# Patient Record
Sex: Male | Born: 1970 | Race: Black or African American | Hispanic: No | Smoking: Never smoker
Health system: Southern US, Community
[De-identification: ages and names within clinical notes are randomized; demographics above are authoritative.]

## PROBLEM LIST (undated history)

## (undated) DIAGNOSIS — F419 Anxiety disorder, unspecified: Secondary | ICD-10-CM

## (undated) DIAGNOSIS — F32A Depression, unspecified: Secondary | ICD-10-CM

## (undated) DIAGNOSIS — B019 Varicella without complication: Secondary | ICD-10-CM

## (undated) HISTORY — PX: APPENDECTOMY: SHX54

## (undated) HISTORY — DX: Varicella without complication: B01.9

## (undated) HISTORY — DX: Depression, unspecified: F32.A

## (undated) HISTORY — DX: Anxiety disorder, unspecified: F41.9

## (undated) HISTORY — PX: ESOPHAGOGASTRODUODENOSCOPY: SHX1529

---

## 2008-01-03 ENCOUNTER — Emergency Department: Payer: Self-pay | Admitting: Emergency Medicine

## 2008-01-27 ENCOUNTER — Emergency Department: Payer: Self-pay | Admitting: Emergency Medicine

## 2008-02-03 ENCOUNTER — Emergency Department: Payer: Self-pay | Admitting: Emergency Medicine

## 2008-03-18 ENCOUNTER — Emergency Department: Payer: Self-pay | Admitting: Emergency Medicine

## 2008-03-25 ENCOUNTER — Emergency Department: Payer: Self-pay | Admitting: Emergency Medicine

## 2018-09-07 HISTORY — PX: COLONOSCOPY: SHX174

## 2019-12-08 ENCOUNTER — Encounter: Payer: Self-pay | Admitting: Family Medicine

## 2019-12-08 ENCOUNTER — Ambulatory Visit (INDEPENDENT_AMBULATORY_CARE_PROVIDER_SITE_OTHER): Payer: Self-pay | Admitting: Family Medicine

## 2019-12-08 ENCOUNTER — Other Ambulatory Visit: Payer: Self-pay

## 2019-12-08 VITALS — BP 98/62 | HR 67 | Temp 98.0°F | Ht 68.75 in | Wt 117.0 lb

## 2019-12-08 DIAGNOSIS — Z13220 Encounter for screening for lipoid disorders: Secondary | ICD-10-CM

## 2019-12-08 DIAGNOSIS — Z1159 Encounter for screening for other viral diseases: Secondary | ICD-10-CM

## 2019-12-08 DIAGNOSIS — R636 Underweight: Secondary | ICD-10-CM

## 2019-12-08 DIAGNOSIS — Z7689 Persons encountering health services in other specified circumstances: Secondary | ICD-10-CM

## 2019-12-08 DIAGNOSIS — Z114 Encounter for screening for human immunodeficiency virus [HIV]: Secondary | ICD-10-CM

## 2019-12-08 NOTE — Progress Notes (Signed)
   Subjective:    Patient ID: Derrick Quinn, male    DOB: 1970-08-12, 49 y.o.   MRN: [id number]  HPI Chief Complaint  Patient presents with  . New Patient (Initial Visit)    pt desire to gain weight, wants to discuss options   THis is a 49 yo male who presents today to establish care. His son established with me earlier this week. Will be starting a new job at [employer]. Lives with 34 yo son.   Last CPE- long time ago Colonoscopy- last year, had h. pylori Tdap- unsure Covid- has not had, concerned Flu- not usually Dental- over due Eye- this year Exercise- walking, active, household/ yard work ETOH-  2x/ year  Mother with cancer. Still alive. Unsure what kind.   Father- healthy  Underweight- usually 130-145, lost weight with hospitalization July 2020. Very sick. H. Pylori. Has gained some weight back, was 98 pounds.   Review of Systems Denies headache, visual changes, chest pain, shortness of breath, abdominal pain, nausea, vomiting, diarrhea, constipation, dysuria, hematuria, muscle/joint pain    Objective:   Physical Exam Physical Exam  Constitutional: Oriented to person, place, and time. Appears well-developed and well-nourished.  HENT:  Head: Normocephalic and atraumatic.  Eyes: Conjunctivae are normal.  Neck: Normal range of motion. Neck supple.  Cardiovascular: Normal rate, regular rhythm and normal heart sounds.   Pulmonary/Chest: Effort normal and breath sounds normal.  Musculoskeletal: No lower extremity edema.   Neurological: Alert and oriented to person, place, and time.  Skin: Skin is warm and dry.  Psychiatric: Normal mood and affect. Behavior is normal. Judgment and thought content normal.  Vitals reviewed.     BP 98/62   Pulse 67   Temp 98 F (36.7 C) (Temporal)   Ht 5' 8.75" (1.746 m)   Wt 117 lb (53.1 kg)   SpO2 99%   BMI 17.40 kg/m      Depression screen [facility] 2/9 12/08/2019  Decreased Interest 0  Down, Depressed, Hopeless 0  PHQ - 2 Score  0    Assessment & Plan:  1. Encounter to establish care - will request records from prior PCP  2. Underweight - suggested he get lab work, he wishes to return at a later time - encouraged him to eat adequate protein, fat - has gained back some weight since illness - TSH; Future - Lipid panel; Future - CBC with Differential/Platelet; Future - Comprehensive metabolic panel; Future  3. Encounter for hepatitis C screening test for low risk patient - Hepatitis C antibody; Future  4. Screening for HIV without presence of risk factors - HIV Antibody (routine testing w rflx); Future  5. Screening for lipid disorders - Lipid panel; Future  This visit occurred during the SARS-CoV-2 public health emergency.  Safety protocols were in place, including screening questions prior to the visit, additional usage of staff PPE, and extensive cleaning of exam room while observing appropriate contact time as indicated for disinfecting solutions.    Olean Ree, FNP-BC  [facility], [facility]  12/10/2019 8:41 AM

## 2019-12-10 ENCOUNTER — Encounter: Payer: Self-pay | Admitting: Family Medicine

## 2019-12-11 ENCOUNTER — Other Ambulatory Visit: Payer: Self-pay

## 2020-07-15 ENCOUNTER — Encounter: Payer: Self-pay | Admitting: Emergency Medicine

## 2020-07-15 ENCOUNTER — Ambulatory Visit
Admission: EM | Admit: 2020-07-15 | Discharge: 2020-07-15 | Disposition: A | Discharge status: Home / Self Care | Payer: Self-pay | Attending: Emergency Medicine | Admitting: Emergency Medicine

## 2020-07-15 ENCOUNTER — Other Ambulatory Visit: Payer: Self-pay

## 2020-07-15 DIAGNOSIS — B349 Viral infection, unspecified: Secondary | ICD-10-CM

## 2020-07-15 DIAGNOSIS — Z20822 Contact with and (suspected) exposure to covid-19: Secondary | ICD-10-CM

## 2020-07-15 NOTE — ED Provider Notes (Signed)
Derrick Quinn    CSN: [id number] Arrival date & time: 07/15/20  1038      History   Chief Complaint Chief Complaint  Patient presents with  . Back Pain    HPI Derrick Quinn is a 50 y.o. male.   Patient presents with 3-day history of headache, back pain, nonproductive cough, and low-grade fever.  T-max 100.7.  He requests a COVID test.  He denies rash, ear pain, sore throat, shortness of breath, abdominal pain, dysuria, vomiting, diarrhea, or other symptoms.  His back pain is nonradiating and has improved with OTC pain reliever.  He denies pertinent medical history.  No falls or injury.  The history is provided by the patient.    Past Medical History:  Diagnosis Date  . Chicken pox   . Depression     There are no problems to display for this patient.   Past Surgical History:  Procedure Laterality Date  . APPENDECTOMY    . COLONOSCOPY  09/2018       Home Medications    Prior to Admission medications   Not on File    Family History Family History  Problem Relation Age of Onset  . Cancer Mother   . Mental illness Sister     Social History Social History   Tobacco Use  . Smoking status: Never Smoker  . Smokeless tobacco: Never Used  Vaping Use  . Vaping Use: Never used  Substance Use Topics  . Alcohol use: Yes    Comment: two times per year  . Drug use: Never     Allergies   Patient has no known allergies.   Review of Systems Review of Systems  Constitutional: Positive for fever. Negative for chills.  HENT: Negative for ear pain and sore throat.   Respiratory: Positive for cough. Negative for shortness of breath.   Cardiovascular: Negative for chest pain and palpitations.  Gastrointestinal: Negative for abdominal pain, diarrhea and vomiting.  Genitourinary: Negative for dysuria and hematuria.  Musculoskeletal: Positive for back pain. Negative for arthralgias.  Skin: Negative for color change and rash.  Neurological: Positive for  headaches. Negative for syncope, weakness and numbness.  All other systems reviewed and are negative.    Physical Exam Triage Vital Signs ED Triage Vitals  Enc Vitals Group     BP      Pulse      Resp      Temp      Temp src      SpO2      Weight      Height      Head Circumference      Peak Flow      Pain Score      Pain Loc      Pain Edu?      Excl. in GC?    No data found.  Updated Vital Signs BP 120/78 (BP Location: Left Arm)   Pulse 76   Temp 99.2 F (37.3 C) (Oral)   Resp 16   SpO2 96%   Visual Acuity Right Eye Distance:   Left Eye Distance:   Bilateral Distance:    Right Eye Near:   Left Eye Near:    Bilateral Near:     Physical Exam Vitals and nursing note reviewed.  Constitutional:      General: He is not in acute distress.    Appearance: He is well-developed. He is not ill-appearing.  HENT:     Head: Normocephalic and atraumatic.  Right Ear: Tympanic membrane normal.     Left Ear: Tympanic membrane normal.     Nose: Nose normal.     Mouth/Throat:     Mouth: Mucous membranes are moist.     Pharynx: Oropharynx is clear.  Eyes:     Conjunctiva/sclera: Conjunctivae normal.  Cardiovascular:     Rate and Rhythm: Normal rate and regular rhythm.     Heart sounds: Normal heart sounds.  Pulmonary:     Effort: Pulmonary effort is normal. No respiratory distress.     Breath sounds: Normal breath sounds.  Abdominal:     Palpations: Abdomen is soft.     Tenderness: There is no abdominal tenderness. There is no right CVA tenderness, left CVA tenderness, guarding or rebound.  Musculoskeletal:        General: No swelling, tenderness, deformity or signs of injury. Normal range of motion.     Cervical back: Neck supple.  Skin:    General: Skin is warm and dry.     Findings: No bruising, erythema, lesion or rash.  Neurological:     General: No focal deficit present.     Mental Status: He is alert and oriented to person, place, and time.      Sensory: No sensory deficit.     Motor: No weakness.     Gait: Gait normal.  Psychiatric:        Mood and Affect: Mood normal.        Behavior: Behavior normal.      UC Treatments / Results  Labs (all labs ordered are listed, but only abnormal results are displayed) Labs Reviewed  NOVEL CORONAVIRUS, NAA    EKG   Radiology No results found.  Procedures Procedures (including critical care time)  Medications Ordered in UC Medications - No data to display  Initial Impression / Assessment and Plan / UC Course  I have reviewed the triage vital signs and the nursing notes.  Pertinent labs & imaging results that were available during my care of the patient were reviewed by me and considered in my medical decision making (see chart for details).   Viral illness, COVID test.  Patient is well-appearing and his exam is reassuring.  Afebrile, vital signs stable.  COVID pending.  Instructed patient to self quarantine until the test result is back.  Discussed symptomatic treatment including Tylenol or ibuprofen, rest, hydration.  Instructed patient to follow up with PCP if his symptoms are not improving.  Patient agrees to plan of care.    Final Clinical Impressions(s) / UC Diagnoses   Final diagnoses:  Encounter for laboratory testing for COVID-19 virus  Viral illness     Discharge Instructions     Your COVID test is pending.  You should self quarantine until the test result is back.    Take Tylenol or ibuprofen as needed for fever or discomfort.  Rest and keep yourself hydrated.    Follow-up with your primary care provider if your symptoms are not improving.        ED Prescriptions    None     PDMP not reviewed this encounter.   Mickie Bail, NP 07/15/20 1139

## 2020-07-15 NOTE — ED Triage Notes (Signed)
Saturday evening started having back pain and headache. Neither area is as painful  as on Saturday.  Pain involved lower back.

## 2020-07-15 NOTE — Discharge Instructions (Signed)
Your COVID test is pending.  You should self quarantine until the test result is back.    Take Tylenol or ibuprofen as needed for fever or discomfort.  Rest and keep yourself hydrated.    Follow-up with your primary care provider if your symptoms are not improving.     

## 2020-07-16 LAB — NOVEL CORONAVIRUS, NAA: SARS-CoV-2, NAA: DETECTED — AB

## 2020-07-16 LAB — SARS-COV-2, NAA 2 DAY TAT

## 2021-06-03 ENCOUNTER — Encounter: Payer: Self-pay | Admitting: Family Medicine

## 2021-06-03 ENCOUNTER — Ambulatory Visit (INDEPENDENT_AMBULATORY_CARE_PROVIDER_SITE_OTHER): Payer: Self-pay | Admitting: Family Medicine

## 2021-06-03 ENCOUNTER — Other Ambulatory Visit: Payer: Self-pay

## 2021-06-03 ENCOUNTER — Ambulatory Visit (INDEPENDENT_AMBULATORY_CARE_PROVIDER_SITE_OTHER)
Admission: RE | Admit: 2021-06-03 | Discharge: 2021-06-03 | Disposition: A | Discharge status: Home / Self Care | Payer: Self-pay | Source: Ambulatory Visit | Attending: Family Medicine | Admitting: Family Medicine

## 2021-06-03 VITALS — BP 122/74 | HR 78 | Temp 97.3°F | Ht 68.75 in | Wt 126.1 lb

## 2021-06-03 DIAGNOSIS — M25562 Pain in left knee: Secondary | ICD-10-CM

## 2021-06-03 DIAGNOSIS — G8929 Other chronic pain: Secondary | ICD-10-CM

## 2021-06-03 DIAGNOSIS — M25511 Pain in right shoulder: Secondary | ICD-10-CM | POA: Insufficient documentation
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
## 2021-06-03 NOTE — Patient Instructions (Addendum)
Try the passive range of motion shoulder exercises ? ?Use voltaren gel 1% over the counter on affected areas up to four times daily (knee and shoulder)  ? ?Ask the pharmacist about over the counter lidocaine topical also  ? ?Use ice also for 10 minutes when you can , for pain and swelling ? ?Xrays today  ?Plan to follow  ? ? ? ? ? ?
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
## 2021-06-05 ENCOUNTER — Telehealth: Payer: Self-pay | Admitting: *Deleted
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
## 2021-06-05 NOTE — Telephone Encounter (Signed)
Left VM requesting pt to call the office back 

## 2021-06-09 ENCOUNTER — Telehealth: Payer: Self-pay

## 2021-06-09 ENCOUNTER — Telehealth: Payer: Self-pay | Admitting: Family Medicine

## 2021-06-09 DIAGNOSIS — G8929 Other chronic pain: Secondary | ICD-10-CM

## 2021-06-09 NOTE — Telephone Encounter (Signed)
Addressed through result notes  

## 2021-06-09 NOTE — Telephone Encounter (Signed)
Called lvm for patient to call us back regarding results  ?

## 2021-06-09 NOTE — Telephone Encounter (Signed)
-----   Message from Shon Millet, [place] sent at 06/09/2021 12:03 PM EDT ----- ?Pt called back I advised him of xray results. Pt does want to see Ortho he said where every Dr. Milinda Antis recommend is fine ([place] or [place]), pt advised referrals can take up to 2 weeks and if he hasn't heard anything after to 2 weeks to call us back and let us know. Pt verbalized understanding  ?
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
## 2021-06-23 ENCOUNTER — Ambulatory Visit (INDEPENDENT_AMBULATORY_CARE_PROVIDER_SITE_OTHER): Payer: Self-pay | Admitting: Orthopaedic Surgery

## 2021-06-23 DIAGNOSIS — M25562 Pain in left knee: Secondary | ICD-10-CM

## 2021-06-23 DIAGNOSIS — G8929 Other chronic pain: Secondary | ICD-10-CM
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
## 2021-06-24 ENCOUNTER — Other Ambulatory Visit: Payer: Self-pay

## 2021-06-24 DIAGNOSIS — G8929 Other chronic pain: Secondary | ICD-10-CM

## 2021-07-05 ENCOUNTER — Other Ambulatory Visit: Payer: No Typology Code available for payment source

## 2021-07-09 ENCOUNTER — Ambulatory Visit (INDEPENDENT_AMBULATORY_CARE_PROVIDER_SITE_OTHER): Payer: No Typology Code available for payment source | Admitting: Nurse Practitioner

## 2021-07-09 ENCOUNTER — Ambulatory Visit (INDEPENDENT_AMBULATORY_CARE_PROVIDER_SITE_OTHER)
Admission: RE | Admit: 2021-07-09 | Discharge: 2021-07-09 | Disposition: A | Discharge status: Home / Self Care | Payer: No Typology Code available for payment source | Source: Ambulatory Visit | Attending: Nurse Practitioner | Admitting: Nurse Practitioner

## 2021-07-09 ENCOUNTER — Encounter: Payer: Self-pay | Admitting: Nurse Practitioner

## 2021-07-09 VITALS — BP 100/68 | HR 79 | Temp 96.7°F | Resp 16 | Ht 68.75 in | Wt 127.4 lb

## 2021-07-09 DIAGNOSIS — M545 Low back pain, unspecified: Secondary | ICD-10-CM

## 2021-07-09 DIAGNOSIS — F419 Anxiety disorder, unspecified: Secondary | ICD-10-CM

## 2021-07-09 DIAGNOSIS — Z8619 Personal history of other infectious and parasitic diseases: Secondary | ICD-10-CM | POA: Insufficient documentation

## 2021-07-09 DIAGNOSIS — F32A Depression, unspecified: Secondary | ICD-10-CM | POA: Insufficient documentation

## 2021-07-09 MED ORDER — HYDROXYZINE HCL 10 MG PO TABS: 10.0000 mg | ORAL_TABLET | Freq: Three times a day (TID) | ORAL | 0 refills | Status: DC | PRN

## 2021-07-09 NOTE — Patient Instructions (Signed)
Nice to see you today ?I will be in touch with labs and xray once I have the results ?Follow up with me in 3 months for your physical  ?

## 2021-07-09 NOTE — Assessment & Plan Note (Addendum)
Seems to be a chronic thing.  Was evaluated in [place], pending records.  We will obtain lumbar picture in office today.  Patient has tried physical therapy in the past along with medication management it sounds like.  Refused injections.  States MRI of the back approximately a few years ago.  No red flag symptoms on exam.  Likely will need to be referred to orthopedist for further management.  He is already seeing Dr. Jean Rosenthal in regards to his left knee ?

## 2021-07-09 NOTE — Assessment & Plan Note (Signed)
Patient has a history of H. pylori.  Per his report he was treated he is concerned that it was not eradicated.  We will send in stool sample to make sure H. pylori has been eradicated. ?
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
## 2021-07-09 NOTE — Assessment & Plan Note (Signed)
Straight the same states that he is to be on Xanax and another medication he cannot know the name of.  PHQ-9 and GAD-7 administered in office patient denies HI/SI/AVH.  We will start patient on hydroxyzine 10 mg 3 times daily as needed anxiety.  Follow-up if no improvement ?

## 2021-07-11 ENCOUNTER — Other Ambulatory Visit: Payer: No Typology Code available for payment source

## 2021-07-16 ENCOUNTER — Ambulatory Visit
Admission: EM | Admit: 2021-07-16 | Discharge: 2021-07-16 | Disposition: A | Discharge status: Home / Self Care | Payer: No Typology Code available for payment source | Attending: Emergency Medicine | Admitting: Emergency Medicine

## 2021-07-16 DIAGNOSIS — G8929 Other chronic pain: Secondary | ICD-10-CM

## 2021-07-16 DIAGNOSIS — M545 Low back pain, unspecified: Secondary | ICD-10-CM

## 2021-07-16 MED ORDER — IBUPROFEN 600 MG PO TABS: 600.0000 mg | ORAL_TABLET | Freq: Four times a day (QID) | ORAL | 0 refills | Status: DC | PRN

## 2021-07-16 NOTE — ED Triage Notes (Signed)
Patient presents to Urgent Care with complaints of back pain since this morning. He states he has a hx of chronic back pain. States it is worse today when he woke up trying to stand. He states it feels like he "locked his back." Has not taken any meds.  ?

## 2021-07-16 NOTE — Discharge Instructions (Addendum)
Take the ibuprofen as directed for your back pain.  Follow up with an orthopedist or your primary care provider if your symptoms are not improving.   ? ?

## 2021-07-16 NOTE — ED Provider Notes (Addendum)
?[facility] ? ? ? ?CSN: [id number] ?Arrival date & time: 07/16/21  [id number] ? ? ?  ? ?History   ?Chief Complaint ?Chief Complaint  ?Patient presents with  ? Back Pain  ? ? ?HPI ?Derrick Quinn is a 51 y.o. male.  Patient presents with back pain for several years.  He states when he woke up this morning his back pain was sharp and 10/10.  It is currently 7/10 and feels "tight."  He states he is unable to go to work today and needs a note.  No OTC medications taken.  No recent falls or injury.  He denies numbness, weakness, paresthesias, saddle anesthesia, abdominal pain, dysuria, hematuria, vomiting, diarrhea, constipation, loss of bowel/bladder control, or other symptoms.  Patient was seen by his PCP for his back pain on 07/09/2021;  X-ray of the lumbar spine obtained and it showed degenerative changes; PCP's note indicates that the back pain is chronic and that patient should see Ortho.  He is being followed by Ortho for chronic left knee pain. ? ? ? ?The history is provided by the patient and medical records.  ? ?Past Medical History:  ?Diagnosis Date  ? Anxiety   ? Chicken pox   ? Depression   ? ? ?Patient Active Problem List  ? Diagnosis Date Noted  ? Lumbar pain 07/09/2021  ? History of Helicobacter pylori infection 07/09/2021  ? Anxiety 07/09/2021  ? Left knee pain 06/03/2021  ? Right shoulder pain 06/03/2021  ? ? ?Past Surgical History:  ?Procedure Laterality Date  ? APPENDECTOMY    ? COLONOSCOPY  09/2018  ? ? ? ? ? ?Home Medications   ? ?Prior to Admission medications   ?Medication Sig Start Date End Date Taking? Authorizing Provider  ?ibuprofen (ADVIL) 600 MG tablet Take 1 tablet (600 mg total) by mouth every 6 (six) hours as needed. 07/16/21  Yes Sharion Balloon, NP  ?hydrOXYzine (ATARAX) 10 MG tablet Take 1 tablet (10 mg total) by mouth 3 (three) times daily as needed. 07/09/21   Michela Pitcher, NP  ? ? ?Family History ?Family History  ?Problem Relation Age of Onset  ? Cancer Mother   ? Mental illness  Sister   ? Diabetes Paternal Grandmother   ? Hypertension Paternal Grandfather   ? ? ?Social History ?Social History  ? ?Tobacco Use  ? Smoking status: Never  ? Smokeless tobacco: Never  ?Vaping Use  ? Vaping Use: Never used  ?Substance Use Topics  ? Alcohol use: Yes  ?  Comment: two times per year  ? Drug use: Never  ? ? ? ?Allergies   ?Patient has no known allergies. ? ? ?Review of Systems ?Review of Systems  ?Constitutional:  Negative for chills and fever.  ?Gastrointestinal:  Negative for abdominal pain, constipation, diarrhea and vomiting.  ?Genitourinary:  Negative for dysuria, frequency, hematuria, penile discharge and testicular pain.  ?Musculoskeletal:  Positive for back pain. Negative for gait problem.  ?Skin:  Negative for color change, rash and wound.  ?Neurological:  Negative for weakness and numbness.  ?All other systems reviewed and are negative. ? ? ?Physical Exam ?Triage Vital Signs ?ED Triage Vitals  ?Enc Vitals Group  ?   BP   ?   Pulse   ?   Resp   ?   Temp   ?   Temp src   ?   SpO2   ?   Weight   ?   Height   ?   Head  Circumference   ?   Peak Flow   ?   Pain Score   ?   Pain Loc   ?   Pain Edu?   ?   Excl. in [place]?   ? ?No data found. ? ?Updated Vital Signs ?BP 126/65 (BP Location: Left Arm)   Pulse 71   Temp 97.7 ?F (36.5 ?C) (Temporal)   Resp 16   SpO2 97%  ? ?Visual Acuity ?Right Eye Distance:   ?Left Eye Distance:   ?Bilateral Distance:   ? ?Right Eye Near:   ?Left Eye Near:    ?Bilateral Near:    ? ?Physical Exam ?Vitals and nursing note reviewed.  ?Constitutional:   ?   General: He is not in acute distress. ?   Appearance: He is well-developed. He is not ill-appearing.  ?HENT:  ?   Mouth/Throat:  ?   Mouth: Mucous membranes are moist.  ?Cardiovascular:  ?   Rate and Rhythm: Normal rate and regular rhythm.  ?Pulmonary:  ?   Effort: Pulmonary effort is normal. No respiratory distress.  ?   Breath sounds: Normal breath sounds.  ?Abdominal:  ?   General: Bowel sounds are normal. There is no  distension.  ?   Palpations: Abdomen is soft.  ?   Tenderness: There is no abdominal tenderness. There is no right CVA tenderness, left CVA tenderness, guarding or rebound.  ?Musculoskeletal:     ?   General: No swelling, tenderness, deformity or signs of injury. Normal range of motion.  ?   Cervical back: Neck supple.  ?Skin: ?   General: Skin is warm and dry.  ?   Capillary Refill: Capillary refill takes less than 2 seconds.  ?   Findings: No bruising, erythema, lesion or rash.  ?Neurological:  ?   General: No focal deficit present.  ?   Mental Status: He is alert and oriented to person, place, and time.  ?   Sensory: No sensory deficit.  ?   Motor: No weakness.  ?   Gait: Gait normal.  ?Psychiatric:     ?   Mood and Affect: Mood normal.     ?   Behavior: Behavior normal.  ? ? ? ?UC Treatments / Results  ?Labs ?(all labs ordered are listed, but only abnormal results are displayed) ?Labs Reviewed - No data to display ? ?EKG ? ? ?Radiology ?No results found. ? ?Procedures ?Procedures (including critical care time) ? ?Medications Ordered in UC ?Medications - No data to display ? ?Initial Impression / Assessment and Plan / UC Course  ?I have reviewed the triage vital signs and the nursing notes. ? ?Pertinent labs & imaging results that were available during my care of the patient were reviewed by me and considered in my medical decision making (see chart for details). ? ?Chronic back pain.  Work note provided per patient request.  Treating with ibuprofen.  Education provided on chronic back pain.  Instructed patient to follow-up with an orthopedist or his PCP if his symptoms persist.  He agrees to plan of care. ? ? ?Final Clinical Impressions(s) / UC Diagnoses  ? ?Final diagnoses:  ?Chronic left-sided low back pain without sciatica  ? ? ? ?Discharge Instructions   ? ?  ?Take the ibuprofen as directed for your back pain.  Follow up with an orthopedist or your primary care provider if your symptoms are not improving.    ? ? ? ? ? ?ED Prescriptions   ? ? Medication  Sig Dispense Auth. Provider  ? ibuprofen (ADVIL) 600 MG tablet Take 1 tablet (600 mg total) by mouth every 6 (six) hours as needed. 30 tablet Sharion Balloon, NP  ? ?  ? ?I have reviewed the PDMP during this encounter. ?  ?Sharion Balloon, NP ?07/16/21 [id number] ? ?  ?Sharion Balloon, NP ?07/16/21 [id number] ? ?

## 2021-07-19 ENCOUNTER — Other Ambulatory Visit: Payer: No Typology Code available for payment source

## 2021-07-21 ENCOUNTER — Telehealth: Payer: Self-pay

## 2021-07-21 NOTE — Telephone Encounter (Signed)
Received notice that patient did not review his xray results on mychart. ?Left message for patient to call back ? ?Nicholes, ?  ?I have reviewed the report on the xray on your back. Did not some degenerative changes but nothing else acute. I will await the records from you other provider. If Dr. Ninfa Linden will take a look at the back that would be great if not I can refer you to a back specialist. Let me know ?  ?Thanks, ?Romilda Garret, DNP, AGNP-C ? ?Also, per chart notes patient went to ER on 07/16/21 for back pain.  ?

## 2021-07-23 ENCOUNTER — Other Ambulatory Visit: Payer: Self-pay | Admitting: Nurse Practitioner

## 2021-07-23 DIAGNOSIS — M545 Low back pain, unspecified: Secondary | ICD-10-CM

## 2021-07-23 NOTE — Telephone Encounter (Signed)
Referral placed.

## 2021-07-23 NOTE — Telephone Encounter (Signed)
Spoke with patient. He saw Dr Ninfa Linden for his knee but not the back, needs a referral to his office for back issues to address with them please. ?

## 2021-07-27 ENCOUNTER — Inpatient Hospital Stay: Admission: RE | Admit: 2021-07-27 | Payer: No Typology Code available for payment source | Source: Ambulatory Visit

## 2021-08-06 ENCOUNTER — Other Ambulatory Visit: Payer: No Typology Code available for payment source

## 2021-08-06 ENCOUNTER — Ambulatory Visit
Admission: EM | Admit: 2021-08-06 | Discharge: 2021-08-06 | Disposition: A | Discharge status: Home / Self Care | Payer: No Typology Code available for payment source | Attending: Emergency Medicine | Admitting: Emergency Medicine

## 2021-08-06 ENCOUNTER — Encounter: Payer: Self-pay | Admitting: Emergency Medicine

## 2021-08-06 DIAGNOSIS — F410 Panic disorder [episodic paroxysmal anxiety] without agoraphobia: Secondary | ICD-10-CM

## 2021-08-06 DIAGNOSIS — Z8619 Personal history of other infectious and parasitic diseases: Secondary | ICD-10-CM

## 2021-08-06 MED ORDER — HYDROXYZINE HCL 50 MG PO TABS: 50.0000 mg | ORAL_TABLET | Freq: Four times a day (QID) | ORAL | 1 refills | Status: DC | PRN

## 2021-08-06 NOTE — ED Triage Notes (Signed)
Pt states 2 days ago he had a panic attack. He felt like he couldn't catch his breath and called EMS. An ekg was performed and he was given the option to go to the ED but he refused. He made an appt with his PCP to get anxiety meds changed because he doesn't think its helping. He doesn't think he can make it to work today and needs a note.

## 2021-08-06 NOTE — Discharge Instructions (Addendum)
I am increasing the hydroxyzine dose.  I think that this will be more effective than the lower dose that was prescribed to earlier.  If it is too sedating, then you can cut it in half.  I will go ahead and start taking this until you are seen by your PCP on Friday

## 2021-08-06 NOTE — ED Provider Notes (Signed)
HPI  SUBJECTIVE:  Derrick Quinn is a 51 y.o. male who presents with several panic attacks occurring this week.  He states he normally gets 1 to 3/year.  He had one on Tuesday, called EMS, where they did an EKG, which was normal.  He declined to go to the ED.  He had a panic attack today lasting approximately 10 minutes that he describes as symptoms of palpitations, shortness of breath and "feeling scared".  He has had these since his 45s, and states that this week's episodes are identical to the VS panic attack.  He has been under increased amount of stress recently.  He denies chest pain, nausea, diaphoresis, presyncope, syncope, cough, wheezing, calf pain, swelling, surgery in the past 4 weeks, recent immobilization, hemoptysis.  He tried calling himself with breathing techniques which resolved his symptoms.  No aggravating factors.  He has a past medical history of panic attacks, has been prescribed hydroxyzine in the past, has not yet picked up the prescription.  He has also taken Paxil, Xanax, and Zoloft for his panic attacks.  No history of PE, DVT, MI, hypercholesterolemia, diabetes, hypertension, arrhythmia, thyroid disease.  He has follow-up with his PCP in [facility] in 2 days.  He needs a work note for today and tomorrow.    Past Medical History:  Diagnosis Date   Anxiety    Chicken pox    Depression     Past Surgical History:  Procedure Laterality Date   APPENDECTOMY     COLONOSCOPY  09/2018    Family History  Problem Relation Age of Onset   Cancer Mother    Mental illness Sister    Diabetes Paternal Grandmother    Hypertension Paternal Grandfather     Social History   Tobacco Use   Smoking status: Never   Smokeless tobacco: Never  Vaping Use   Vaping Use: Never used  Substance Use Topics   Alcohol use: Yes    Comment: two times per year   Drug use: Never    No current facility-administered medications for this encounter.  Current Outpatient  Medications:    hydrOXYzine (ATARAX) 50 MG tablet, Take 1 tablet (50 mg total) by mouth every 6 (six) hours as needed for anxiety. May take 2 tabs at night, Disp: 30 tablet, Rfl: 1  No Known Allergies   ROS  As noted in HPI.   Physical Exam  BP 116/77 (BP Location: Left Arm)   Pulse 75   Temp 98.6 F (37 C) (Oral)   Resp 16   SpO2 97%   Constitutional: Well developed, well nourished, no acute distress Eyes:  EOMI, conjunctiva normal bilaterally HENT: Normocephalic, atraumatic,mucus membranes moist Neck: No thyromegaly, thyroid tenderness Respiratory: Normal inspiratory effort, lungs clear bilaterally Cardiovascular: Normal rate, regular rhythm, no murmurs rubs or gallop GI: nondistended skin: No rash, skin intact Musculoskeletal: No lower extremity edema, calf tenderness. Neurologic: Alert & oriented x 3, no focal neuro deficits Psychiatric: Speech and behavior appropriate   ED Course   Medications - No data to display  No orders of the defined types were placed in this encounter.   No results found for this or any previous visit (from the past 24 hour(s)). No results found.  ED Clinical Impression  1. Panic attacks      ED Assessment/Plan  Reviewed EKG done by EMS brought in by patient 2 days ago.  Normal sinus rhythm, rate 81.  Normal axis, normal intervals.  No ST-T wave changes.  He was symptomatic while the EKG was obtained.  Presentation consistent with a panic attack.  Patient is PERC negative.  Patient states that he has been getting them since his 64s and this felt identical to this.  He has not yet picked up his Atarax prescription- will start him on a higher dose as I think that this will be more likely to work than 10 mg.  May cut in half if he finds it too sedating.  Advised him to go ahead and pick it up and start that to hold him over until he sees his PCP on Friday.  Will write a work note for today and tomorrow.  Discussed  MDM, treatment plan,  and plan for follow-up with patient. Discussed sn/sx that should prompt return to the ED. patient agrees with plan.   Meds ordered this encounter  Medications   hydrOXYzine (ATARAX) 50 MG tablet    Sig: Take 1 tablet (50 mg total) by mouth every 6 (six) hours as needed for anxiety. May take 2 tabs at night    Dispense:  30 tablet    Refill:  1      *This clinic note was created using [employer]. Therefore, there may be occasional mistakes despite careful proofreading.  ?    Melynda Ripple, MD 08/06/21 2032

## 2021-08-07 LAB — HELICOBACTER PYLORI  SPECIAL ANTIGEN
MICRO NUMBER:: 13464769
RESULT:: DETECTED — AB
SPECIMEN QUALITY: ADEQUATE

## 2021-08-08 ENCOUNTER — Ambulatory Visit: Payer: No Typology Code available for payment source | Admitting: Nurse Practitioner

## 2021-08-08 ENCOUNTER — Other Ambulatory Visit: Payer: Self-pay | Admitting: Nurse Practitioner

## 2021-08-08 DIAGNOSIS — A048 Other specified bacterial intestinal infections: Secondary | ICD-10-CM

## 2021-08-08 MED ORDER — AMOXICILLIN 500 MG PO CAPS: 1000.0000 mg | ORAL_CAPSULE | Freq: Two times a day (BID) | ORAL | 0 refills | Status: AC

## 2021-08-08 MED ORDER — CLARITHROMYCIN 500 MG PO TABS: 500.0000 mg | ORAL_TABLET | Freq: Two times a day (BID) | ORAL | 0 refills | Status: AC

## 2021-08-08 MED ORDER — OMEPRAZOLE 20 MG PO CPDR: 20.0000 mg | DELAYED_RELEASE_CAPSULE | Freq: Two times a day (BID) | ORAL | 0 refills | Status: DC

## 2021-08-10 ENCOUNTER — Inpatient Hospital Stay: Admission: RE | Admit: 2021-08-10 | Payer: No Typology Code available for payment source | Source: Ambulatory Visit

## 2021-08-13 ENCOUNTER — Other Ambulatory Visit: Payer: Self-pay | Admitting: Nurse Practitioner

## 2021-08-13 DIAGNOSIS — M545 Low back pain, unspecified: Secondary | ICD-10-CM

## 2021-08-13 DIAGNOSIS — F419 Anxiety disorder, unspecified: Secondary | ICD-10-CM

## 2021-08-20 ENCOUNTER — Other Ambulatory Visit: Payer: No Typology Code available for payment source

## 2021-09-03 ENCOUNTER — Other Ambulatory Visit: Payer: No Typology Code available for payment source

## 2021-09-11 ENCOUNTER — Other Ambulatory Visit: Payer: Self-pay | Admitting: Nurse Practitioner

## 2021-09-11 DIAGNOSIS — M545 Low back pain, unspecified: Secondary | ICD-10-CM

## 2021-09-11 DIAGNOSIS — F419 Anxiety disorder, unspecified: Secondary | ICD-10-CM

## 2021-09-11 DIAGNOSIS — A048 Other specified bacterial intestinal infections: Secondary | ICD-10-CM

## 2021-09-23 ENCOUNTER — Telehealth: Payer: Self-pay

## 2021-09-23 ENCOUNTER — Ambulatory Visit
Admission: EM | Admit: 2021-09-23 | Discharge: 2021-09-23 | Disposition: A | Discharge status: Home / Self Care | Payer: No Typology Code available for payment source | Attending: Family Medicine | Admitting: Family Medicine

## 2021-09-23 DIAGNOSIS — R0789 Other chest pain: Secondary | ICD-10-CM

## 2021-09-23 DIAGNOSIS — R9431 Abnormal electrocardiogram [ECG] [EKG]: Secondary | ICD-10-CM | POA: Diagnosis not present

## 2021-09-23 NOTE — Discharge Instructions (Addendum)
Your EKG shows abnormal and given your current symptoms you warrant further evaluation to ensure you are not experiencing any acute cardiac problems.  I also can't rule out your symptoms are related to untreated H. pylori infection.  It is important to get started on your treatment for H. Pylori, as GI symptoms can cause burning and pain in the chest.  However if anything significant is persist following a visit today to go to the emergency department

## 2021-09-23 NOTE — Telephone Encounter (Signed)
Can we call and see if the patient took the medications or not. Also he has not came in for the blood work as requested.  H pylori is an infection that can cause pain. This can develop into an ulcer and ulcers can cause gastrointestinal bleeds

## 2021-09-23 NOTE — ED Notes (Signed)
Provider at bedside

## 2021-09-23 NOTE — Telephone Encounter (Signed)
[facility] Night - Client TELEPHONE ADVICE RECORD AccessNurse Patient Name: Marnette Burgess SR . Gender: Male DOB: 1970-08-03 Age: 51 Y 10 M 26 D Return Phone Number: [phone] (Primary) Address: City/ State/ Zip: [place] [address] Client [facility] Night - Client Client Site [facility] - Night Provider AA - PHYSICIAN, Crissie Figures- MD Contact Type Call Who Is Calling Patient / Member / Family / Caregiver Call Type Triage / Clinical Relationship To Patient Self Return Phone Number [phone] (Primary) Chief Complaint Medication Question (non symptomatic) Reason for Call Symptomatic / Request for Health Information Initial Comment Caller states he needs to change the phone number on file for him and his son. the new number is [phone]. He also had a question about medication and H. Pylori and what would happen if he didnt take the medication. Translation No Nurse Assessment Nurse: Ladona Ridgel, RN, Clydie Braun Date/Time Lamount Cohen Time): 09/22/2021 9:53:31 PM Confirm and document reason for call. If symptomatic, describe symptoms. ---He also had a question about medication and H. Pylori and what would happen if he didn't take the medication. He has not picked up the rx as of yet. Denies any symptoms. Does the patient have any new or worsening symptoms? ---No Please document clinical information provided and list any resource used. ---[email or web address]-tate.com/ Disp. Time Lamount Cohen Time) Disposition Final User 09/22/2021 9:42:58 PM Send To RN Personal Raymon Mutton, RN, Lisa 09/22/2021 9:56:57 PM Clinical Call Yes Ladona Ridgel RN, Clydie Braun Final Disposition 09/22/2021 9:56:57 PM Clinical Call Yes Ladona Ridgel, RN, Clydie Braun Comments User: Marlene Bast, RN Date/Time Lamount Cohen Time): 09/22/2021 9:56:47 PM Advised him that infection would not treated adn can get wore of he does  not take the abx. Verbalized understandin
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
## 2021-09-23 NOTE — ED Triage Notes (Signed)
Patient presents to Urgent Care with complaints of chest pain and SOB since about 1700. Chest pain described as a tightness feeling at center of chest. He states he did not take anything for pain. Hx of heartburn and anxiety. He reports this feeling different. During visit pt refused BP collection.

## 2021-09-23 NOTE — ED Provider Notes (Signed)
Derrick Quinn    CSN: [id number] Arrival date & time: 09/23/21  1924      History   Chief Complaint No chief complaint on file.   HPI Derrick Quinn is a 51 y.o. male.   HPI Patient with history of anxiety, untreated H.pylori, and recurrent back pain, presents this afternoon for evaluation and episode of chest pain which occurred about an hour ago.  Patient reports chest pain developed after having any stressful conversation with the family member.  He reports that the chest pain occurred in the middle of his chest lasted for approximately 30 to 45 minutes.  He reports having shortness of breath during the episode of chest pain.  Patient has also recently been prescribed treatment for H. pylori if however has not been able to pick up the medication and start the regimen.  He endorses also recently eating pizza over the last 24 hours and is uncertain if this is anything to do with his symptoms.  He is currently asymptomatic of chest pain. Patient also refused to allow staff to check his blood pressure while obtaining vital signs.  Past Medical History:  Diagnosis Date   Anxiety    Chicken pox    Depression     Patient Active Problem List   Diagnosis Date Noted   Lumbar pain 07/09/2021   History of Helicobacter pylori infection 07/09/2021   Anxiety 07/09/2021   Left knee pain 06/03/2021   Right shoulder pain 06/03/2021    Past Surgical History:  Procedure Laterality Date   APPENDECTOMY     COLONOSCOPY  09/2018       Home Medications    Prior to Admission medications   Medication Sig Start Date End Date Taking? Authorizing Provider  hydrOXYzine (ATARAX) 50 MG tablet Take 1 tablet (50 mg total) by mouth every 6 (six) hours as needed for anxiety. May take 2 tabs at night 08/06/21   Domenick Gong, MD  omeprazole (PRILOSEC) 20 MG capsule Take 1 capsule (20 mg total) by mouth 2 (two) times daily before a meal. 08/08/21   Cable, Genene Churn, NP    Family  History Family History  Problem Relation Age of Onset   Cancer Mother    Mental illness Sister    Diabetes Paternal Grandmother    Hypertension Paternal Grandfather     Social History Social History   Tobacco Use   Smoking status: Never   Smokeless tobacco: Never  Vaping Use   Vaping Use: Never used  Substance Use Topics   Alcohol use: Yes    Comment: two times per year   Drug use: Never     Allergies   Patient has no known allergies.  Review of Systems Review of Systems Pertinent negatives listed in HPI   Physical Exam Triage Vital Signs ED Triage Vitals  Enc Vitals Group     BP      Pulse      Resp      Temp      Temp src      SpO2      Weight      Height      Head Circumference      Peak Flow      Pain Score      Pain Loc      Pain Edu?      Excl. in GC?    No data found.  Updated Vital Signs Pulse 77   Temp 98.8 F (37.1 C) (Oral)  Resp 18   SpO2 98%   Visual Acuity Right Eye Distance:   Left Eye Distance:   Bilateral Distance:    Right Eye Near:   Left Eye Near:    Bilateral Near:     Physical Exam Vitals and nursing note reviewed.  Constitutional:      Appearance: He is well-developed.  HENT:     Head: Normocephalic.  Eyes:     Extraocular Movements: Extraocular movements intact.     Pupils: Pupils are equal, round, and reactive to light.  Cardiovascular:     Rate and Rhythm: Normal rate and regular rhythm.  Pulmonary:     Effort: Pulmonary effort is normal.  Musculoskeletal:        General: Normal range of motion.     Cervical back: Normal range of motion.  Skin:    General: Skin is warm and dry.     Capillary Refill: Capillary refill takes less than 2 seconds.  Neurological:     General: No focal deficit present.     Mental Status: He is alert and oriented to person, place, and time.  Psychiatric:        Attention and Perception: Attention normal.        Mood and Affect: Mood is anxious.        Thought Content:  Thought content normal.        Cognition and Memory: Cognition normal.        Judgment: Judgment normal.      UC Treatments / Results  Labs (all labs ordered are listed, but only abnormal results are displayed) Labs Reviewed - No data to display  EKG SR, BPM 85, RBBB (incomplete), w/ right atrial enlargement   Radiology No results found.  Procedures Procedures (including critical care time)  Medications Ordered in UC Medications - No data to display  Initial Impression / Assessment and Plan / UC Course  I have reviewed the triage vital signs and the nursing notes.  Pertinent labs & imaging results that were available during my care of the patient were reviewed by me and considered in my medical decision making (see chart for details).    Atypical chest pain, patient is extremely anxious during today's encounter and suspect overall mental status is may have possibly contributed to episode of chest pain. ECG negative for any acute changes, however  Revealed RBBB and right atrial enlargement with normal rate so significant ST changes. Given symptoms, recommend further evaluation in setting of ER to rule out there causes of CP. Of note I did discuss, the possibility that GI symptoms can cause CP and given he has not started treatment for H. Pylori and admits to recently eating highly acid and spicy food, that could likely be contributory to CP symptoms.  Patient reports as he is currently not having chest pain and he may watch and wait at home and if any symptoms recur he would then go to the ER.  Subsequently stated you may go to the ER now for attempted planning to start his treatment for H. pylori.  Red flag precautions given patient discharged. Final Clinical Impressions(s) / UC Diagnoses   Final diagnoses:  Atypical chest pain     Discharge Instructions      If chest pain worsens go immediately to the emergency department.  I would suspect her symptoms are related to  untreated H. pylori infection.  It is important to get started on your treatment for H. Pylori, as GI symptoms can  cause burning and pain in the chest.  However if anything significant is persist following a visit today to go to the emergency department Your EKG here in clinic   ED Prescriptions   None    PDMP not reviewed this encounter.   Bing Neighbors, FNP 09/26/21 [phone]

## 2021-09-24 ENCOUNTER — Other Ambulatory Visit: Payer: No Typology Code available for payment source

## 2021-09-24 NOTE — Telephone Encounter (Signed)
I would like to have his labs as soon as possible. The recheck for h pylori is just a stool kit that he picks up and brings back

## 2021-09-24 NOTE — Telephone Encounter (Signed)
Patient advised and lab appt made for 10/01/21

## 2021-09-30 ENCOUNTER — Other Ambulatory Visit: Payer: No Typology Code available for payment source

## 2021-10-28 ENCOUNTER — Other Ambulatory Visit: Payer: Self-pay | Admitting: Nurse Practitioner

## 2021-10-28 DIAGNOSIS — F419 Anxiety disorder, unspecified: Secondary | ICD-10-CM

## 2021-10-28 DIAGNOSIS — Z8619 Personal history of other infectious and parasitic diseases: Secondary | ICD-10-CM

## 2021-10-29 ENCOUNTER — Ambulatory Visit
Admission: EM | Admit: 2021-10-29 | Discharge: 2021-10-29 | Disposition: A | Discharge status: Home / Self Care | Payer: No Typology Code available for payment source | Attending: Emergency Medicine | Admitting: Emergency Medicine

## 2021-10-29 DIAGNOSIS — M545 Low back pain, unspecified: Secondary | ICD-10-CM

## 2021-10-29 MED ORDER — MELOXICAM 7.5 MG PO TABS: 7.5000 mg | ORAL_TABLET | Freq: Every day | ORAL | 0 refills | Status: DC

## 2021-10-29 MED ORDER — CYCLOBENZAPRINE HCL 10 MG PO TABS: 10.0000 mg | ORAL_TABLET | Freq: Every day | ORAL | 0 refills | Status: DC

## 2021-10-29 NOTE — ED Triage Notes (Signed)
Pt presents with low back pain from picking up box on Tuesday

## 2021-10-29 NOTE — Discharge Instructions (Signed)
Your pain is most likely caused by irritation to the muscles.  Starting tomorrow take meloxicam every morning with food for the next 5 days, this will reduce the inflammatory response that occurs with injury and in turn help with your pain, you may use Tylenol 500 to 1000 mg every 6 hours throughout the day while using this medication  Use muscle relaxer at bedtime as needed for additional comfort, be mindful this medication may make you drowsy, if this occurs you may take half a dose  You may use heating pad in 15 minute intervals as needed for additional comfort, or you may find comfort in using ice in 10-15 minutes over affected area  Begin stretching affected area daily for 10 minutes as tolerated to further loosen muscles   When lying down place pillow underneath and between knees for support  Can try sleeping without pillow on firm mattress   Practice good posture: head back, shoulders back, chest forward, pelvis back and weight distributed evenly on both legs  If pain persist after recommended treatment or reoccurs if may be beneficial to follow up with orthopedic specialist for evaluation, this doctor specializes in the bones and can manage your symptoms long-term with options such as but not limited to imaging, medications or physical therapy

## 2021-10-30 NOTE — ED Provider Notes (Addendum)
[name]    CSN: [id number] Arrival date & time: 10/29/21  1926      History   Chief Complaint Chief Complaint  Patient presents with   Back Pain    HPI Derrick Quinn is a 51 y.o. male.   Patient presents with left-sided lower back pain beginning 1 day ago after lifting up a box.  Pain has been constant worsened when bending, rated a 6 out of 10 and described as a discomfort.  Pain does not radiate.  Has attempted use of massage which was somewhat helpful.  Has not attempted treatment further.  Denies numbness, tingling, prior injury or trauma.  Past Medical History:  Diagnosis Date   Anxiety    Chicken pox    Depression     Patient Active Problem List   Diagnosis Date Noted   Lumbar pain 07/09/2021   History of Helicobacter pylori infection 07/09/2021   Anxiety 07/09/2021   Left knee pain 06/03/2021   Right shoulder pain 06/03/2021    Past Surgical History:  Procedure Laterality Date   APPENDECTOMY     COLONOSCOPY  09/2018       Home Medications    Prior to Admission medications   Medication Sig Start Date End Date Taking? Authorizing Provider  cyclobenzaprine (FLEXERIL) 10 MG tablet Take 1 tablet (10 mg total) by mouth at bedtime. 10/29/21  Yes Ilee Randleman R, NP  meloxicam (MOBIC) 7.5 MG tablet Take 1 tablet (7.5 mg total) by mouth daily. 10/29/21  Yes Pinchos Topel, Elita Boone, NP  hydrOXYzine (ATARAX) 50 MG tablet Take 1 tablet (50 mg total) by mouth every 6 (six) hours as needed for anxiety. May take 2 tabs at night 08/06/21   Domenick Gong, MD  omeprazole (PRILOSEC) 20 MG capsule Take 1 capsule (20 mg total) by mouth 2 (two) times daily before a meal. 08/08/21   Cable, Genene Churn, NP    Family History Family History  Problem Relation Age of Onset   Cancer Mother    Mental illness Sister    Diabetes Paternal Grandmother    Hypertension Paternal Grandfather     Social History Social History   Tobacco Use   Smoking status: Never    Smokeless tobacco: Never  Vaping Use   Vaping Use: Never used  Substance Use Topics   Alcohol use: Yes    Comment: two times per year   Drug use: Never     Allergies   Patient has no known allergies.   Review of Systems Review of Systems  Constitutional: Negative.   Respiratory: Negative.    Cardiovascular: Negative.   Musculoskeletal:  Positive for back pain. Negative for arthralgias, gait problem, joint swelling, myalgias, neck pain and neck stiffness.  Skin: Negative.      Physical Exam Triage Vital Signs ED Triage Vitals [10/29/21 1945]  Enc Vitals Group     BP      Pulse Rate 77     Resp 18     Temp 98.3 F (36.8 C)     Temp Source Oral     SpO2 96 %     Weight      Height      Head Circumference      Peak Flow      Pain Score 6     Pain Loc      Pain Edu?      Excl. in GC?    No data found.  Updated Vital Signs Pulse 77  Temp 98.3 F (36.8 C) (Oral)   Resp 18   SpO2 96%   Visual Acuity Right Eye Distance:   Left Eye Distance:   Bilateral Distance:    Right Eye Near:   Left Eye Near:    Bilateral Near:     Physical Exam Constitutional:      Appearance: Normal appearance.  HENT:     Head: Normocephalic.  Eyes:     Extraocular Movements: Extraocular movements intact.  Pulmonary:     Effort: Pulmonary effort is normal.  Musculoskeletal:     Comments: Tenderness is present over the left lower latissimus dorsi without ecchymosis, swelling or deformity, no spinal tenderness noted, negative straight leg test, able to sit on the exam table erect without complication, range of motion is intact  Neurological:     Mental Status: He is alert and oriented to person, place, and time. Mental status is at baseline.  Psychiatric:        Mood and Affect: Mood normal.        Behavior: Behavior normal.      UC Treatments / Results  Labs (all labs ordered are listed, but only abnormal results are displayed) Labs Reviewed - No data to  display  EKG   Radiology No results found.  Procedures Procedures (including critical care time)  Medications Ordered in UC Medications - No data to display  Initial Impression / Assessment and Plan / UC Course  I have reviewed the triage vital signs and the nursing notes.  Pertinent labs & imaging results that were available during my care of the patient were reviewed by me and considered in my medical decision making (see chart for details).  Acute left-sided low back pain without sciatica  ED etiology of symptoms is most likely muscular, will defer imaging due to lack of injury or trauma, prescribed meloxicam and Flexeril for outpatient use, recommended RICE, heat, massage, daily stretching and activity as tolerated, given walking referral to orthopedics if symptoms persist and worsen, work note given  Patient declined blood pressure measurement due to anxiety Final Clinical Impressions(s) / UC Diagnoses   Final diagnoses:  Acute left-sided low back pain without sciatica     Discharge Instructions      Your pain is most likely caused by irritation to the muscles.  Starting tomorrow take meloxicam every morning with food for the next 5 days, this will reduce the inflammatory response that occurs with injury and in turn help with your pain, you may use Tylenol 500 to 1000 mg every 6 hours throughout the day while using this medication  Use muscle relaxer at bedtime as needed for additional comfort, be mindful this medication may make you drowsy, if this occurs you may take half a dose  You may use heating pad in 15 minute intervals as needed for additional comfort, or you may find comfort in using ice in 10-15 minutes over affected area  Begin stretching affected area daily for 10 minutes as tolerated to further loosen muscles   When lying down place pillow underneath and between knees for support  Can try sleeping without pillow on firm mattress   Practice good  posture: head back, shoulders back, chest forward, pelvis back and weight distributed evenly on both legs  If pain persist after recommended treatment or reoccurs if may be beneficial to follow up with orthopedic specialist for evaluation, this doctor specializes in the bones and can manage your symptoms long-term with options such as but not limited  to imaging, medications or physical therapy      ED Prescriptions     Medication Sig Dispense Auth. Provider   meloxicam (MOBIC) 7.5 MG tablet Take 1 tablet (7.5 mg total) by mouth daily. 30 tablet Ritha Sampedro R, NP   cyclobenzaprine (FLEXERIL) 10 MG tablet Take 1 tablet (10 mg total) by mouth at bedtime. 10 tablet Valinda Hoar, NP      PDMP not reviewed this encounter.   Valinda Hoar, NP 10/30/21 0824    Valinda Hoar, NP 10/30/21 [phone]

## 2021-11-06 ENCOUNTER — Other Ambulatory Visit: Payer: No Typology Code available for payment source

## 2021-11-17 ENCOUNTER — Ambulatory Visit
Admission: EM | Admit: 2021-11-17 | Discharge: 2021-11-17 | Discharge status: Against Medical Advice | Payer: No Typology Code available for payment source

## 2021-11-17 ENCOUNTER — Encounter: Payer: Self-pay | Admitting: Emergency Medicine

## 2021-11-17 ENCOUNTER — Ambulatory Visit
Admission: EM | Admit: 2021-11-17 | Discharge: 2021-11-17 | Disposition: A | Discharge status: Home / Self Care | Payer: Self-pay | Attending: Urgent Care | Admitting: Urgent Care

## 2021-11-17 DIAGNOSIS — J069 Acute upper respiratory infection, unspecified: Secondary | ICD-10-CM

## 2021-11-17 DIAGNOSIS — Z20822 Contact with and (suspected) exposure to covid-19: Secondary | ICD-10-CM | POA: Insufficient documentation

## 2021-11-17 NOTE — ED Triage Notes (Signed)
Patient c/o headache x 2 days.   Patient denies fever.   Patient endorses chest tightness and throat itchiness at times.   Patient is requesting COVID testing.   Patient hasn't taken any medications for symptoms.

## 2021-11-17 NOTE — ED Provider Notes (Addendum)
Derrick Quinn    CSN: [id number] Arrival date & time: 11/17/21  1338      History   Chief Complaint Chief Complaint  Patient presents with   Headache    HPI Derrick Quinn is a 51 y.o. male.   Presents with 2-day history of symptoms including headache, throat itching, chest tightness.  Denies fever . he denies shortness of breath.  Denies coughing.  Denies sneezing denies nasal congestion.  States he has possible exposure as he was recently in a group setting without his mask.  Requesting COVID testing and work note.   Headache   Past Medical History:  Diagnosis Date   Anxiety    Chicken pox    Depression     Patient Active Problem List   Diagnosis Date Noted   Lumbar pain 07/09/2021   History of Helicobacter pylori infection 07/09/2021   Anxiety 07/09/2021   Left knee pain 06/03/2021   Right shoulder pain 06/03/2021    Past Surgical History:  Procedure Laterality Date   APPENDECTOMY     COLONOSCOPY  09/2018       Home Medications    Prior to Admission medications   Medication Sig Start Date End Date Taking? Authorizing Provider  cyclobenzaprine (FLEXERIL) 10 MG tablet Take 1 tablet (10 mg total) by mouth at bedtime. 10/29/21   Valinda Hoar, NP  hydrOXYzine (ATARAX) 50 MG tablet Take 1 tablet (50 mg total) by mouth every 6 (six) hours as needed for anxiety. May take 2 tabs at night 08/06/21   Domenick Gong, MD  meloxicam (MOBIC) 7.5 MG tablet Take 1 tablet (7.5 mg total) by mouth daily. 10/29/21   White, Elita Boone, NP  omeprazole (PRILOSEC) 20 MG capsule Take 1 capsule (20 mg total) by mouth 2 (two) times daily before a meal. Patient taking differently: Take 20 mg by mouth as needed. 08/08/21   Eden Emms, NP    Family History Family History  Problem Relation Age of Onset   Cancer Mother    Mental illness Sister    Diabetes Paternal Grandmother    Hypertension Paternal Grandfather     Social History Social History   Tobacco Use    Smoking status: Never   Smokeless tobacco: Never  Vaping Use   Vaping Use: Never used  Substance Use Topics   Alcohol use: Yes    Comment: two times per year   Drug use: Never     Allergies   Patient has no known allergies.   Review of Systems Review of Systems  Neurological:  Positive for headaches.     Physical Exam Triage Vital Signs ED Triage Vitals  Enc Vitals Group     BP 11/17/21 1455 116/74     Pulse Rate 11/17/21 1455 73     Resp 11/17/21 1455 18     Temp 11/17/21 1455 98.5 F (36.9 C)     Temp Source 11/17/21 1455 Oral     SpO2 11/17/21 1455 98 %     Weight --      Height --      Head Circumference --      Peak Flow --      Pain Score 11/17/21 1458 5     Pain Loc --      Pain Edu? --      Excl. in GC? --    No data found.  Updated Vital Signs BP 116/74 (BP Location: Left Arm)   Pulse 73   Temp  98.5 F (36.9 C) (Oral)   Resp 18   SpO2 98%   Visual Acuity Right Eye Distance:   Left Eye Distance:   Bilateral Distance:    Right Eye Near:   Left Eye Near:    Bilateral Near:     Physical Exam Vitals reviewed.  Constitutional:      Appearance: He is well-developed.  HENT:     Head: Normocephalic and atraumatic.     Right Ear: Tympanic membrane normal.     Left Ear: Tympanic membrane normal.     Mouth/Throat:     Mouth: Mucous membranes are moist.  Cardiovascular:     Rate and Rhythm: Normal rate and regular rhythm.  Pulmonary:     Effort: Pulmonary effort is normal.     Breath sounds: Normal breath sounds.  Musculoskeletal:     Cervical back: Normal range of motion and neck supple.  Neurological:     Mental Status: He is alert.  Psychiatric:        Mood and Affect: Mood normal.        Behavior: Behavior normal.      UC Treatments / Results  Labs (all labs ordered are listed, but only abnormal results are displayed) Labs Reviewed  SARS CORONAVIRUS 2 (TAT 6-24 HRS)    EKG   Radiology No results  found.  Procedures Procedures (including critical care time)  Medications Ordered in UC Medications - No data to display  Initial Impression / Assessment and Plan / UC Course  I have reviewed the triage vital signs and the nursing notes.  Pertinent labs & imaging results that were available during my care of the patient were reviewed by me and considered in my medical decision making (see chart for details).    Physical exam is remarkable.  Lungs CTAB.  Likely upper respiratory infection.  Nasal swab obtained.  Patient advised to await results.  Work note provided.  May use OTC remedies for symptom control.  Final Clinical Impressions(s) / UC Diagnoses   Final diagnoses:  Viral upper respiratory infection     Discharge Instructions      Follow up with your primary care provider     ED Prescriptions   None    PDMP not reviewed this encounter.   Charma Igo, FNP 11/17/21 1538    Charma Igo, FNP 11/17/21 1546

## 2021-11-17 NOTE — Discharge Instructions (Signed)
Follow up with your primary care provider.

## 2021-11-18 LAB — SARS CORONAVIRUS 2 (TAT 6-24 HRS): SARS Coronavirus 2: NEGATIVE

## 2021-11-27 ENCOUNTER — Encounter: Payer: Self-pay | Admitting: Emergency Medicine

## 2021-11-27 ENCOUNTER — Ambulatory Visit
Admission: EM | Admit: 2021-11-27 | Discharge: 2021-11-27 | Disposition: A | Discharge status: Home / Self Care | Payer: No Typology Code available for payment source

## 2021-11-27 ENCOUNTER — Telehealth: Payer: Self-pay | Admitting: Nurse Practitioner

## 2021-11-27 DIAGNOSIS — R1084 Generalized abdominal pain: Secondary | ICD-10-CM

## 2021-11-27 NOTE — Discharge Instructions (Addendum)
Follow-up with your primary care provider regarding your symptoms today.

## 2021-11-27 NOTE — Telephone Encounter (Signed)
Agree with precautions given to pt  Agree with nurse assessment in plan.  Thank you for speaking with them. 

## 2021-11-27 NOTE — Telephone Encounter (Signed)
Patient called in stating he is having shortness of breath and some stomach pain. He stated it feel like he is going to have a panic attack. Sent over to access nurse.

## 2021-11-27 NOTE — Telephone Encounter (Signed)
I spoke with pt; pt is on [facility] way to [facility] [place] now; pt said started suddenly at 3 PM today he began with chest tightness,sob and upper lt abd pain that is dull and comes and goes. Pt said he did not want to wait for appt at [facility] on 11/28/21. Pt will be seen today at [facility] and will call with update tomorrow., sending note to T Dugal FNP and Romilda Garret NP who is out of office and Anastasiya CMA.

## 2021-11-27 NOTE — Telephone Encounter (Signed)
[place] Day - Client TELEPHONE ADVICE RECORD [facility] Patient Name: Derrick Quinn Gender: Male DOB: [birth year] Age: 51 Y 50 D Return Phone Number: [phone] (Primary) Address: City/ State/ Zip: [place] [address] Client [place] Day - Client Client Site [place] - Day Provider Romilda Garret- NP Contact Type Call Who Is Calling Patient / Member / Family / Caregiver Call Type Triage / Clinical Relationship To Patient Self Return Phone Number [phone] (Primary) Chief Complaint BREATHING - shortness of breath or sounds breathless Reason for Call Symptomatic / Request for [place] states she has a pt on the line who has shortness of breath and stomach pain. [name] DOB 07-26-1970 Translation No Nurse Assessment Nurse: Dawna Part RN, Jarrett Soho Date/Time (Eastern Time): 11/27/2021 4:34:11 PM Confirm and document reason for call. If symptomatic, describe symptoms. ---Caller reports shortness of breath and abdominal pain. Does the patient have any new or worsening symptoms? ---Yes Will a triage be completed? ---Yes Related visit to physician within the last 2 weeks? ---No Does the PT have any chronic conditions? (i.e. diabetes, asthma, this includes High risk factors for pregnancy, etc.) ---No Is this a behavioral health or substance abuse call? ---No Guidelines Guideline Title Affirmed Question Affirmed Notes Nurse Date/Time (Eastern Time) Anxiety and Panic Attack [1] Symptoms of anxiety or panic attack AND [2] is a chronic symptom (recurrent or ongoing AND present > 4 weeks) Dawna Part, RN, Jarrett Soho 11/27/2021 4:37:03 PM Disp. Time Eilene Ghazi Time) Disposition Final User 11/27/2021 4:33:22 PM Send to Urgent [place] NOTE: All timestamps contained within this report are represented as [place] Standard Time. CONFIDENTIALTY NOTICE:  This fax transmission is intended only for the addressee. It contains information that is legally privileged, confidential or otherwise protected from use or disclosure. If you are not the intended recipient, you are strictly prohibited from reviewing, disclosing, copying using or disseminating any of this information or taking any action in reliance on or regarding this information. If you have received this fax in error, please notify us immediately by telephone so that we can arrange for its return to [place]. Phone: [phone], Toll-Free: [phone], Fax: [phone] Page: 2 of 2 Call Id: [id number] [place]. Time Eilene Ghazi Time) Disposition Final User 11/27/2021 4:40:22 PM See PCP within 2 Weeks Yes Dawna Part, RN, Jarrett Soho Final Disposition 11/27/2021 4:40:22 PM See PCP within 2 Weeks Yes Riddle, RN, Hayes Ludwig Disagree/Comply Comply Caller Understands Yes PreDisposition Call Doctor Care Advice Given Per Guideline SEE PCP WITHIN 2 WEEKS: CARE ADVICE given per Anxiety and Panic Attack (Adult) guideline. Referrals REFERRED TO PCP OFFIC

## 2021-11-27 NOTE — ED Triage Notes (Signed)
Patient c/o generalized ABD pain that started 0300 today.   Patient endorses nausea at times.   Patient endorses mid sternal chest discomfort " I think from eating a lot of cheese last night, eating 2 pizza's, just the cheese".   Patient hasn't taken any medications for symptoms.   History of acid reflux.

## 2021-11-27 NOTE — ED Provider Notes (Signed)
Derrick Quinn    CSN: [id number] Arrival date & time: 11/27/21  1747      History   Chief Complaint Chief Complaint  Patient presents with   Abdominal Pain    HPI Derrick Quinn is a 51 y.o. male.    Abdominal Pain   Patient presents to urgent care with report of generalized abdominal pain that started at 3 AM this morning when he "ate a lot of cheese from 2 pieces."  States his symptoms have resolved.  During his discomfort he endorses nausea, some midsternal chest discomfort that he attributes to cheese being stuck in his throat.  He states that this discomfort is also resolved.  Denies epigastric pain.  Pain is periumbilical.  Now resolved.  Endorses history of acid reflux.  Also history of anxiety which he attributes to his great concern and presents today at urgent care.  His anxiety is treated with hydroxyzine which he did not take today.  Past Medical History:  Diagnosis Date   Anxiety    Chicken pox    Depression     Patient Active Problem List   Diagnosis Date Noted   Lumbar pain 17/51/0258   History of Helicobacter pylori infection 07/09/2021   Anxiety 07/09/2021   Left knee pain 06/03/2021   Right shoulder pain 06/03/2021    Past Surgical History:  Procedure Laterality Date   APPENDECTOMY     COLONOSCOPY  09/2018       Home Medications    Prior to Admission medications   Medication Sig Start Date End Date Taking? Authorizing Provider  cyclobenzaprine (FLEXERIL) 10 MG tablet Take 1 tablet (10 mg total) by mouth at bedtime. 10/29/21  Yes White, Leitha Schuller, NP  hydrOXYzine (ATARAX) 50 MG tablet Take 1 tablet (50 mg total) by mouth every 6 (six) hours as needed for anxiety. May take 2 tabs at night 08/06/21   Melynda Ripple, MD  meloxicam (MOBIC) 7.5 MG tablet Take 1 tablet (7.5 mg total) by mouth daily. 10/29/21   White, Leitha Schuller, NP  omeprazole (PRILOSEC) 20 MG capsule Take 1 capsule (20 mg total) by mouth 2 (two) times daily before a  meal. Patient taking differently: Take 20 mg by mouth as needed. 08/08/21   Michela Pitcher, NP    Family History Family History  Problem Relation Age of Onset   Cancer Mother    Mental illness Sister    Diabetes Paternal Grandmother    Hypertension Paternal Grandfather     Social History Social History   Tobacco Use   Smoking status: Never   Smokeless tobacco: Never  Vaping Use   Vaping Use: Never used  Substance Use Topics   Alcohol use: Yes    Comment: two times per year   Drug use: Never     Allergies   Patient has no known allergies.   Review of Systems Review of Systems  Gastrointestinal:  Positive for abdominal pain.     Physical Exam Triage Vital Signs ED Triage Vitals [11/27/21 1844]  Enc Vitals Group     BP 125/86     Pulse Rate 77     Resp 16     Temp 98.5 F (36.9 C)     Temp Source Oral     SpO2 98 %     Weight      Height      Head Circumference      Peak Flow      Pain Score 5  Pain Loc      Pain Edu?      Excl. in GC?    No data found.  Updated Vital Signs BP 125/86 (BP Location: Right Arm)   Pulse 77   Temp 98.5 F (36.9 C) (Oral)   Resp 16   SpO2 98%   Visual Acuity Right Eye Distance:   Left Eye Distance:   Bilateral Distance:    Right Eye Near:   Left Eye Near:    Bilateral Near:     Physical Exam Vitals reviewed.  Constitutional:      Appearance: He is well-developed.  Abdominal:     General: Abdomen is flat. Bowel sounds are normal.     Palpations: Abdomen is soft.     Tenderness: There is no abdominal tenderness. There is no guarding. Negative signs include Murphy's sign and McBurney's sign.  Skin:    General: Skin is warm and dry.  Neurological:     General: No focal deficit present.     Mental Status: He is alert and oriented to person, place, and time.  Psychiatric:        Mood and Affect: Mood is anxious.        Behavior: Behavior normal.      UC Treatments / Results  Labs (all labs ordered  are listed, but only abnormal results are displayed) Labs Reviewed - No data to display  EKG   Radiology No results found.  Procedures Procedures (including critical care time)  Medications Ordered in UC Medications - No data to display  Initial Impression / Assessment and Plan / UC Course  I have reviewed the triage vital signs and the nursing notes.  Pertinent labs & imaging results that were available during my care of the patient were reviewed by me and considered in my medical decision making (see chart for details).   Nonsurgical abdomen.  Bowel sounds are normal.  Abdomen is soft and flat.  No tenderness on exam.  Unclear the precise cause of the symptoms he reports, now resolved.  Encouraged the patient to rest tonight and use the hydroxyzine prescribed by his primary care provider.  Also suggested he reduce his consumption of cheese.   Final Clinical Impressions(s) / UC Diagnoses   Final diagnoses:  None   Discharge Instructions   None    ED Prescriptions   None    PDMP not reviewed this encounter.   Charma Igo, [place] 11/27/21 1903

## 2021-11-28 NOTE — Telephone Encounter (Signed)
Agree with precautions given to pt  Agree with nurse assessment in plan.  Thank you for speaking with them. 

## 2021-11-28 NOTE — Telephone Encounter (Signed)
Pt calling back; pt said was seen at [facility] on 11/27/21 and pt was advised that the cheese could have caused pts symptoms. Pt said woke up this morning continuing with heart palpitations and now fast heart beat. Pt is not sure what heart rate is he just knows it is fast and pt is concerned about being dehydrated. Pt said he is drinking but his mouth is dry, feeling lightheaded, and fast heart beat; pt had abd pain yesterday. No CP, tightness or pressure this morning. Pt is not SOB; no available appts at [facility] today and pt is having someone drive him to [facility] ED now. Pt will cb next wk with update. Sending note to Romilda Garret NP who is out of office and Eugenia Pancoast FNP. Thank you.

## 2021-11-28 NOTE — Telephone Encounter (Signed)
Patient called back in this morning and stated he went to urgent care and they informed him everything was normal. He stated he woke up this morning with his heart beating faster and think he may be dehydrated. Sent over to [place].

## 2021-12-24 ENCOUNTER — Ambulatory Visit: Payer: No Typology Code available for payment source | Admitting: Nurse Practitioner

## 2022-01-14 ENCOUNTER — Ambulatory Visit
Admission: EM | Admit: 2022-01-14 | Discharge: 2022-01-14 | Disposition: A | Discharge status: Home / Self Care | Payer: Medicaid Other | Attending: Emergency Medicine | Admitting: Emergency Medicine

## 2022-01-14 ENCOUNTER — Ambulatory Visit (INDEPENDENT_AMBULATORY_CARE_PROVIDER_SITE_OTHER): Payer: No Typology Code available for payment source

## 2022-01-14 DIAGNOSIS — F419 Anxiety disorder, unspecified: Secondary | ICD-10-CM | POA: Insufficient documentation

## 2022-01-14 DIAGNOSIS — R0602 Shortness of breath: Secondary | ICD-10-CM | POA: Insufficient documentation

## 2022-01-14 DIAGNOSIS — Z1152 Encounter for screening for COVID-19: Secondary | ICD-10-CM | POA: Diagnosis not present

## 2022-01-14 LAB — RESP PANEL BY RT-PCR (RSV, FLU A&B, COVID)  RVPGX2
Influenza A by PCR: NEGATIVE
Influenza B by PCR: NEGATIVE
Resp Syncytial Virus by PCR: NEGATIVE
SARS Coronavirus 2 by RT PCR: NEGATIVE

## 2022-01-14 NOTE — Discharge Instructions (Addendum)
Go to the emergency department if you have shortness of breath or other concerning symptoms.    Follow up with your primary care provider tomorrow.    If you have uncontrolled anxiety, go to the emergency department or  [facility] Urgent [facility] [address] [place], [place] [address]  [phone]    Your COVID, Flu, and RSV tests are pending.

## 2022-01-14 NOTE — ED Triage Notes (Addendum)
Patient to Urgent Care with complaints of shortness of breath. Reports that for several days he has had episodes where he feels like its hard to catch his breath. Denies shortness of breath at this time. Denies any chest pain.   States that he believes it is related to wearing a mask at his job- reports while wearing the mask he can breathe normally then when he removes the mask he feels as though he can't breathe. States that he also believes his stress level is high and wants help "getting it checked".  spO2 98%, respirations even and unlabored. NAD.

## 2022-01-14 NOTE — ED Provider Notes (Signed)
[name]    CSN: [id number] Arrival date & time: 01/14/22  1729      History   Chief Complaint Chief Complaint  Patient presents with   Shortness of Breath    HPI Derrick Quinn is a 51 y.o. male.  Patient presents with intermittent shortness of breath for 3 to 4 days.  He states this is associated with wearing a mask at work and also with anxiety.  He also reports one episode of emesis today.  He denies fever, cough, chest pain, diarrhea, focal weakness, or other symptoms.  No treatment at home.  His medical history includes anxiety and depression.  He denies suicidal or homicidal ideation.    The history is provided by the patient and medical records.    Past Medical History:  Diagnosis Date   Anxiety    Chicken pox    Depression     Patient Active Problem List   Diagnosis Date Noted   Lumbar pain 07/09/2021   History of Helicobacter pylori infection 07/09/2021   Anxiety 07/09/2021   Left knee pain 06/03/2021   Right shoulder pain 06/03/2021    Past Surgical History:  Procedure Laterality Date   APPENDECTOMY     COLONOSCOPY  09/2018       Home Medications    Prior to Admission medications   Medication Sig Start Date End Date Taking? Authorizing Provider  cyclobenzaprine (FLEXERIL) 10 MG tablet Take 1 tablet (10 mg total) by mouth at bedtime. 10/29/21   Valinda Hoar, NP  hydrOXYzine (ATARAX) 50 MG tablet Take 1 tablet (50 mg total) by mouth every 6 (six) hours as needed for anxiety. May take 2 tabs at night 08/06/21   Domenick Gong, MD  meloxicam (MOBIC) 7.5 MG tablet Take 1 tablet (7.5 mg total) by mouth daily. 10/29/21   White, Elita Boone, NP  omeprazole (PRILOSEC) 20 MG capsule Take 1 capsule (20 mg total) by mouth 2 (two) times daily before a meal. Patient taking differently: Take 20 mg by mouth as needed. 08/08/21   Eden Emms, NP    Family History Family History  Problem Relation Age of Onset   Cancer Mother    Mental illness  Sister    Diabetes Paternal Grandmother    Hypertension Paternal Grandfather     Social History Social History   Tobacco Use   Smoking status: Never   Smokeless tobacco: Never  Vaping Use   Vaping Use: Never used  Substance Use Topics   Alcohol use: Yes    Comment: two times per year   Drug use: Never     Allergies   Patient has no known allergies.   Review of Systems Review of Systems  Constitutional:  Negative for chills and fever.  HENT:  Negative for ear pain and sore throat.   Respiratory:  Positive for shortness of breath. Negative for cough.   Cardiovascular:  Negative for chest pain and palpitations.  Gastrointestinal:  Positive for vomiting. Negative for abdominal pain and diarrhea.  Skin:  Negative for rash.  Neurological:  Negative for dizziness, syncope, weakness, numbness and headaches.  Psychiatric/Behavioral:  Negative for self-injury and suicidal ideas. The patient is nervous/anxious.   All other systems reviewed and are negative.    Physical Exam Triage Vital Signs ED Triage Vitals  Enc Vitals Group     BP      Pulse      Resp      Temp  Temp src      SpO2      Weight      Height      Head Circumference      Peak Flow      Pain Score      Pain Loc      Pain Edu?      Excl. in GC?    No data found.  Updated Vital Signs BP 122/86   Pulse 91   Temp 98.1 F (36.7 C)   Resp 18   Ht 5\' 9"  (1.753 m)   Wt 125 lb (56.7 kg)   SpO2 100%   BMI 18.46 kg/m   Visual Acuity Right Eye Distance:   Left Eye Distance:   Bilateral Distance:    Right Eye Near:   Left Eye Near:    Bilateral Near:     Physical Exam Vitals and nursing note reviewed.  Constitutional:      General: He is not in acute distress.    Appearance: Normal appearance. He is well-developed. He is not ill-appearing.  HENT:     Right Ear: Tympanic membrane normal.     Left Ear: Tympanic membrane normal.     Nose: Nose normal.     Mouth/Throat:     Mouth: Mucous  membranes are moist.     Pharynx: Oropharynx is clear.  Eyes:     Pupils: Pupils are equal, round, and reactive to light.  Cardiovascular:     Rate and Rhythm: Normal rate and regular rhythm.     Heart sounds: Normal heart sounds.  Pulmonary:     Effort: Pulmonary effort is normal. No respiratory distress.     Breath sounds: Normal breath sounds.  Abdominal:     General: Bowel sounds are normal.     Palpations: Abdomen is soft.     Tenderness: There is no abdominal tenderness. There is no guarding or rebound.  Musculoskeletal:     Cervical back: Neck supple.  Skin:    General: Skin is warm and dry.  Neurological:     General: No focal deficit present.     Mental Status: He is alert and oriented to person, place, and time.     Sensory: No sensory deficit.     Motor: No weakness.     Gait: Gait normal.  Psychiatric:        Mood and Affect: Mood normal.        Behavior: Behavior normal.      UC Treatments / Results  Labs (all labs ordered are listed, but only abnormal results are displayed) Labs Reviewed  RESP PANEL BY RT-PCR (RSV, FLU A&B, COVID)  RVPGX2    EKG   Radiology DG Chest 2 View  Result Date: 01/14/2022 CLINICAL DATA:  Shortness of breath EXAM: CHEST - 2 VIEW COMPARISON:  01/27/2008 FINDINGS: Cardiac size is within normal limits. There are no signs of pulmonary edema or focal pulmonary consolidation. There is no pleural effusion or pneumothorax. IMPRESSION: There are no focal infiltrates or signs of pulmonary edema. There is no pleural effusion. Electronically Signed   By: 01/29/2008 M.D.   On: 01/14/2022 18:16    Procedures Procedures (including critical care time)  Medications Ordered in UC Medications - No data to display  Initial Impression / Assessment and Plan / UC Course  I have reviewed the triage vital signs and the nursing notes.  Pertinent labs & imaging results that were available during my care of the patient were  reviewed by me and  considered in my medical decision making (see chart for details).   Shortness of breath.  Patient is well-appearing and his exam is reassuring.  O2 sat 100% on room air.  No respiratory distress.  EKG shows sinus rhythm, rate 73, no ST elevation, compared to previous from 09/23/2021.  Patient declines lab work or CBG today; he states he does not like needles. Respiratory panel pending.  CXR shows no acute abnormality.  Discussed ED precautions and follow up with PCP tomorrow.  Discussed ED or behavioral health UC if he has uncontrolled anxiety.  Work note provided per patient request.  Education provided on shortness of breath and managing anxiety.  He agrees to plan of care.   Final Clinical Impressions(s) / UC Diagnoses   Final diagnoses:  Shortness of breath  Anxiety     Discharge Instructions      Go to the emergency department if you have shortness of breath or other concerning symptoms.    Follow up with your primary care provider tomorrow.    If you have uncontrolled anxiety, go to the emergency department or  [facility] [address] [place], [place] [address]  [phone]    Your COVID, Flu, and RSV tests are pending.         ED Prescriptions   None    PDMP not reviewed this encounter.   Mickie Bail, NP 01/14/22 1825

## 2022-01-15 ENCOUNTER — Ambulatory Visit: Payer: Self-pay

## 2022-01-26 ENCOUNTER — Telehealth: Payer: Self-pay

## 2022-01-26 ENCOUNTER — Ambulatory Visit (INDEPENDENT_AMBULATORY_CARE_PROVIDER_SITE_OTHER): Payer: No Typology Code available for payment source | Admitting: Internal Medicine

## 2022-01-26 ENCOUNTER — Encounter: Payer: Self-pay | Admitting: Internal Medicine

## 2022-01-26 VITALS — BP 90/60 | HR 50 | Temp 97.2°F | Ht 70.0 in | Wt 99.4 lb

## 2022-01-26 DIAGNOSIS — R002 Palpitations: Secondary | ICD-10-CM

## 2022-01-26 DIAGNOSIS — K2100 Gastro-esophageal reflux disease with esophagitis, without bleeding: Secondary | ICD-10-CM

## 2022-01-26 DIAGNOSIS — A048 Other specified bacterial intestinal infections: Secondary | ICD-10-CM

## 2022-01-26 DIAGNOSIS — F419 Anxiety disorder, unspecified: Secondary | ICD-10-CM | POA: Diagnosis not present

## 2022-01-26 DIAGNOSIS — E440 Moderate protein-calorie malnutrition: Secondary | ICD-10-CM | POA: Insufficient documentation

## 2022-01-26 DIAGNOSIS — K219 Gastro-esophageal reflux disease without esophagitis: Secondary | ICD-10-CM | POA: Insufficient documentation

## 2022-01-26 MED ORDER — OMEPRAZOLE 20 MG PO CPDR: 20.0000 mg | DELAYED_RELEASE_CAPSULE | Freq: Two times a day (BID) | ORAL | 0 refills | Status: DC

## 2022-01-26 MED ORDER — DULOXETINE HCL 30 MG PO CPEP: 30.0000 mg | ORAL_CAPSULE | Freq: Every day | ORAL | 1 refills | Status: DC

## 2022-01-26 NOTE — Assessment & Plan Note (Signed)
Likely anxiety related Will check labs

## 2022-01-26 NOTE — Telephone Encounter (Signed)
[facility] Night - Client TELEPHONE ADVICE RECORD Access[employer] Patient Name: Derrick Quinn . Gender: Male DOB: [birth year] Age: 51 Y 2 M 28 D Return Phone Number: [phone] (Primary), [phone] (Secondary) Address: City/ State/ Zip: [place] [address] Client [facility] Night - Client Client Site [facility] - Night Provider AA - PHYSICIAN, Crissie Figures- MD Contact Type Call Who Is Calling Patient / Member / Family / Caregiver Call Type Triage / Clinical Relationship To Patient Self Return Phone Number [phone] (Primary) Chief Complaint Heart palpitations or irregular heartbeat Reason for Call Request to Schedule Office Appointment Initial Comment Caller states he needs an appointment for tomorrow the 20th in the morning. He thinks electrolytes are low. Feels tired drain, heart started beating fast. Wants to discuss. Translation No Nurse Assessment Nurse: Juanetta Gosling, RN, [place] Date/Time (Eastern Time): 01/25/2022 6:38:35 PM Confirm and document reason for call. If symptomatic, describe symptoms. ---Caller states feels tired drained, heart started beating fast. Symptoms started today. Does the patient have any new or worsening symptoms? ---Yes Will a triage be completed? ---Yes Related visit to physician within the last 2 weeks? ---No Does the PT have any chronic conditions? (i.e. diabetes, asthma, this includes High risk factors for pregnancy, etc.) ---No Is this a behavioral health or substance abuse call? ---No Guidelines Guideline Title Affirmed Question Affirmed Notes Nurse Date/Time Lamount Cohen Time) Heart Rate and Heartbeat Questions Problems with anxiety or stress Juanetta Gosling, RN, [place] 01/25/2022 6:39:56 PM Disp. Time Lamount Cohen Time) Disposition Final User 01/25/2022 6:44:58 PM See PCP within 2 Weeks Yes Juanetta Gosling, RN, [place] Final Disposition 01/25/2022 6:44:58 PM See PCP within  2 Weeks Yes Juanetta Gosling, RN, [place] PLEASE NOTE: All timestamps contained within this report are represented as [place] Standard Time. CONFIDENTIALTY NOTICE: This fax transmission is intended only for the addressee. It contains information that is legally privileged, confidential or otherwise protected from use or disclosure. If you are not the intended recipient, you are strictly prohibited from reviewing, disclosing, copying using or disseminating any of this information or taking any action in reliance on or regarding this information. If you have received this fax in error, please notify us immediately by telephone so that we can arrange for its return to [place]. Phone: [phone], Toll-Free: [phone], Fax: [phone] Page: 2 of 2 Call Id: [id number] Caller Disagree/Comply Comply Caller Understands Yes PreDisposition Call Doctor Care Advice Given Per Guideline SEE PCP WITHIN 2 WEEKS: * You need to be seen for this ongoing problem within the next 2 weeks. * PCP VISIT: Call your doctor (or NP/PA) during regular office hours and make an appointment. AVOID CAFFEINE: * Avoid caffeine-containing beverages. Reason: Caffeine is a stimulant and can aggravate palpitations. * Examples include coffee, tea, colas, [address], [employer], and some 'energy drinks'. LIMIT ALCOHOL: * Limit your alcohol consumption to no more than 2 drinks a day. CALL BACK IF: * Chest pain, lightheadedness or difficulty breathing occurs * Heart beating over 140 beats / minute * More than 3 extra or skipped beats / minute * You become worse CARE ADVICE given per Heart Rate and Heartbeat Questions (Adult) guideline. Referrals REFERRED TO PCP OFFIC

## 2022-01-26 NOTE — Telephone Encounter (Signed)
I spoke with Derrick Quinn and he said on 01/25/22 Derrick Quinn had fast heart beat and feeling tired. Derrick Quinn said no fast heart beat now but does feel tired. No CP,SOB,H/A or dizziness. Derrick Quinn said at 8:37 AM can be here in 10 mins.sending note to Dr Alphonsus Sias and Alphonsus Sias pool and will teams shannon CMA.

## 2022-01-26 NOTE — Assessment & Plan Note (Signed)
Will need referral to GI if not better quickly

## 2022-01-26 NOTE — Progress Notes (Signed)
Subjective:    Patient ID: Derrick Quinn, male    DOB: April 03, 1970, 51 y.o.   MRN: [id number]  HPI Here with son due to palpitations  Was drinking water yesterday---and then he noticed heart beating a bit fast (endorses rate only a bit over 100 on my demonstration) Last minutes (?) Then vomited---yellow stuff Had some abdominal pain before he drank the water ---but he delays eating (worried about H pylori again) No diarrhea--normal stool yesterday  "I am just worried about everything going on with me"  ER visit about 2 weeks ago---"that was my chest--I felt like I couldn't breathe" CXR normal Worried about stress  No caffeine---it exacerbates his anxiety Has depression as well---coexists with anxiety. This has worsened  Lives with son  Current Outpatient Medications on File Prior to Visit  Medication Sig Dispense Refill   cyclobenzaprine (FLEXERIL) 10 MG tablet Take 1 tablet (10 mg total) by mouth at bedtime. 10 tablet 0   hydrOXYzine (ATARAX) 50 MG tablet Take 1 tablet (50 mg total) by mouth every 6 (six) hours as needed for anxiety. May take 2 tabs at night 30 tablet 1   meloxicam (MOBIC) 7.5 MG tablet Take 1 tablet (7.5 mg total) by mouth daily. 30 tablet 0   omeprazole (PRILOSEC) 20 MG capsule Take 1 capsule (20 mg total) by mouth 2 (two) times daily before a meal. (Patient taking differently: Take 20 mg by mouth as needed.) 28 capsule 0   No current facility-administered medications on file prior to visit.    No Known Allergies  Past Medical History:  Diagnosis Date   Anxiety    Chicken pox    Depression     Past Surgical History:  Procedure Laterality Date   APPENDECTOMY     COLONOSCOPY  09/2018    Family History  Problem Relation Age of Onset   Cancer Mother    Mental illness Sister    Diabetes Paternal Grandmother    Hypertension Paternal Grandfather     Social History   Socioeconomic History   Marital status: Legally Separated    Spouse name:  Not on file   Number of children: 2   Years of education: Not on file   Highest education level: Not on file  Occupational History   Occupation: Housekeeping    Comment: College  Tobacco Use   Smoking status: Never   Smokeless tobacco: Never  Vaping Use   Vaping Use: Never used  Substance and Sexual Activity   Alcohol use: Yes    Comment: two times per year   Drug use: Never   Sexual activity: Not Currently  Other Topics Concern   Not on file  Social History Narrative   Not on file   Social Determinants of Health   Financial Resource Strain: Not on file  Food Insecurity: Not on file  Transportation Needs: Not on file  Physical Activity: Not on file  Stress: Not on file  Social Connections: Not on file  Intimate Partner Violence: Not on file   Review of Systems Has lost weight--25# No regular heartburn--hasn't filled the omeprazole yet (just got his Medicaid) No chest pain now--but noticed some tightness in the shower (and "loosening with the hot water)    Objective:   Physical Exam Constitutional:      Comments: Clear wasting  Cardiovascular:     Rate and Rhythm: Normal rate and regular rhythm.     Heart sounds: No murmur heard.    No gallop.  Pulmonary:  Effort: Pulmonary effort is normal.     Breath sounds: Normal breath sounds. No wheezing or rales.  Abdominal:     Palpations: Abdomen is soft.     Tenderness: There is no abdominal tenderness.  Musculoskeletal:     Cervical back: Neck supple.     Right lower leg: No edema.     Left lower leg: No edema.  Lymphadenopathy:     Cervical: No cervical adenopathy.  Neurological:     Mental Status: He is alert.  Psychiatric:     Comments: Mild anxiety            Assessment & Plan:

## 2022-01-26 NOTE — Telephone Encounter (Signed)
Okay---I will check him when he gets here

## 2022-01-26 NOTE — Assessment & Plan Note (Addendum)
His symptoms mostly seem anxiety related Will try duloxetine 30mg  daily Early follow up with PCP Referral for counseling

## 2022-01-26 NOTE — Addendum Note (Signed)
Addended by: Damita Lack on: 01/26/2022 03:55 PM   Modules accepted: Orders

## 2022-01-26 NOTE — Assessment & Plan Note (Signed)
Having vomiting after eating/drinking---likely sig acid damage Some dysphagia Will have him start the omeprazole Need to proceed with early GI referral if not better right away and eating better

## 2022-02-20 ENCOUNTER — Ambulatory Visit: Payer: No Typology Code available for payment source | Admitting: Nurse Practitioner

## 2022-05-28 ENCOUNTER — Ambulatory Visit: Payer: No Typology Code available for payment source | Admitting: Nurse Practitioner

## 2022-05-29 ENCOUNTER — Ambulatory Visit (INDEPENDENT_AMBULATORY_CARE_PROVIDER_SITE_OTHER): Payer: Medicaid Other | Admitting: Internal Medicine

## 2022-05-29 ENCOUNTER — Encounter: Payer: Self-pay | Admitting: Nurse Practitioner

## 2022-05-29 ENCOUNTER — Encounter: Payer: Self-pay | Admitting: Internal Medicine

## 2022-05-29 VITALS — BP 84/60 | HR 79 | Temp 97.5°F | Ht 70.0 in | Wt 109.0 lb

## 2022-05-29 DIAGNOSIS — F419 Anxiety disorder, unspecified: Secondary | ICD-10-CM

## 2022-05-29 MED ORDER — ALPRAZOLAM 0.25 MG PO TABS: 0.2500 mg | ORAL_TABLET | Freq: Two times a day (BID) | ORAL | 0 refills | Status: DC | PRN

## 2022-05-29 NOTE — Progress Notes (Signed)
   Subjective:    Patient ID: Derrick Quinn, male    DOB: 02-19-1971, 52 y.o.   MRN: [id number]  HPI Here for follow up of his anxiety  2-3 days ago--suddenly felt like he couldn't breath Anxiety kicked in---"when I worry too much" Has to try to do controlled breathing and relax  Thinks he has those spells about once a month Has used xanax in the past  Feels the duloxetine has helped  Current Outpatient Medications on File Prior to Visit  Medication Sig Dispense Refill   cyclobenzaprine (FLEXERIL) 10 MG tablet Take 1 tablet (10 mg total) by mouth at bedtime. 10 tablet 0   DULoxetine (CYMBALTA) 30 MG capsule Take 1 capsule (30 mg total) by mouth daily. 30 capsule 1   hydrOXYzine (ATARAX) 50 MG tablet Take 1 tablet (50 mg total) by mouth every 6 (six) hours as needed for anxiety. May take 2 tabs at night 30 tablet 1   meloxicam (MOBIC) 7.5 MG tablet Take 1 tablet (7.5 mg total) by mouth daily. 30 tablet 0   omeprazole (PRILOSEC) 20 MG capsule Take 1 capsule (20 mg total) by mouth in the morning and at bedtime. On empty stomach 28 capsule 0   No current facility-administered medications on file prior to visit.    No Known Allergies  Past Medical History:  Diagnosis Date   Anxiety    Chicken pox    Depression     Past Surgical History:  Procedure Laterality Date   APPENDECTOMY     COLONOSCOPY  09/2018    Family History  Problem Relation Age of Onset   Cancer Mother    Mental illness Sister    Diabetes Paternal Grandmother    Hypertension Paternal Grandfather     Social History   Socioeconomic History   Marital status: Legally Separated    Spouse name: Not on file   Number of children: 2   Years of education: Not on file   Highest education level: Not on file  Occupational History   Occupation: Housekeeping    Comment: College  Tobacco Use   Smoking status: Never   Smokeless tobacco: Never  Vaping Use   Vaping Use: Never used  Substance and Sexual Activity    Alcohol use: Yes    Comment: two times per year   Drug use: Never   Sexual activity: Not Currently  Other Topics Concern   Not on file  Social History Narrative   2 children--different moms   Social Determinants of Health   Financial Resource Strain: Not on file  Food Insecurity: Not on file  Transportation Needs: Not on file  Physical Activity: Not on file  Stress: Not on file  Social Connections: Not on file  Intimate Partner Violence: Not on file   Review of Systems Trying to change eating habits---anxiety seems to flare with unhealthy foods like potato chips, pizza, etc Has gained back about 10#    Objective:   Physical Exam         Assessment & Plan:

## 2022-05-29 NOTE — Assessment & Plan Note (Signed)
Overall is better on the low dose duloxetine 30mg  Sounds like an increase is not needed now--but discussed that we would increase if it seems to stop working Will give a few xanax for prn use Try again for [facility] site

## 2022-06-01 ENCOUNTER — Encounter: Payer: Self-pay | Admitting: *Deleted

## 2022-06-26 ENCOUNTER — Encounter: Payer: No Typology Code available for payment source | Admitting: Nurse Practitioner

## 2022-06-30 ENCOUNTER — Encounter: Payer: Self-pay | Admitting: Nurse Practitioner

## 2022-06-30 ENCOUNTER — Other Ambulatory Visit (HOSPITAL_COMMUNITY)
Admission: RE | Admit: 2022-06-30 | Discharge: 2022-06-30 | Disposition: A | Discharge status: Home / Self Care | Payer: No Typology Code available for payment source | Source: Ambulatory Visit | Attending: Nurse Practitioner | Admitting: Nurse Practitioner

## 2022-06-30 ENCOUNTER — Ambulatory Visit (INDEPENDENT_AMBULATORY_CARE_PROVIDER_SITE_OTHER): Payer: Medicaid Other | Admitting: Nurse Practitioner

## 2022-06-30 VITALS — BP 100/62 | HR 65 | Temp 98.4°F | Resp 16 | Ht 69.0 in | Wt 111.2 lb

## 2022-06-30 DIAGNOSIS — Z8619 Personal history of other infectious and parasitic diseases: Secondary | ICD-10-CM

## 2022-06-30 DIAGNOSIS — K219 Gastro-esophageal reflux disease without esophagitis: Secondary | ICD-10-CM

## 2022-06-30 DIAGNOSIS — Z113 Encounter for screening for infections with a predominantly sexual mode of transmission: Secondary | ICD-10-CM

## 2022-06-30 DIAGNOSIS — E440 Moderate protein-calorie malnutrition: Secondary | ICD-10-CM | POA: Diagnosis not present

## 2022-06-30 DIAGNOSIS — N50812 Left testicular pain: Secondary | ICD-10-CM | POA: Diagnosis not present

## 2022-06-30 DIAGNOSIS — F32A Depression, unspecified: Secondary | ICD-10-CM

## 2022-06-30 DIAGNOSIS — F419 Anxiety disorder, unspecified: Secondary | ICD-10-CM

## 2022-06-30 DIAGNOSIS — Z0000 Encounter for general adult medical examination without abnormal findings: Secondary | ICD-10-CM

## 2022-06-30 DIAGNOSIS — Z125 Encounter for screening for malignant neoplasm of prostate: Secondary | ICD-10-CM

## 2022-06-30 DIAGNOSIS — Z13220 Encounter for screening for lipoid disorders: Secondary | ICD-10-CM

## 2022-06-30 NOTE — Assessment & Plan Note (Signed)
Patient with omeprazole 20 mg.  Does have history of H. pylori never picked up antibiotic treatment.  Will retest for H. pylori continue omeprazole 20 mg daily

## 2022-06-30 NOTE — Assessment & Plan Note (Signed)
In the setting of left testicular pain with benign exam and no stable sexual partner with history of unprotected sex screen for STIs.  Pending results

## 2022-06-30 NOTE — Assessment & Plan Note (Signed)
History of the same on duloxetine 30 mg.  Continue medication as prescribed patient denies HI/SI/AVH.

## 2022-06-30 NOTE — Assessment & Plan Note (Signed)
Patient has gained weight since last office visit.  Will have a follow-up fairly close in 3 months to recheck weight to make sure he is still gaining if patient continues to gain we will continue to monitor if not sent to nutritionist.

## 2022-06-30 NOTE — Assessment & Plan Note (Signed)
Screening for STIs no concern for torsion or epididymitis in office.  This could be positional for patient states it does come and go without no injury

## 2022-06-30 NOTE — Progress Notes (Signed)
Established Patient Office Visit  Subjective   Patient ID: Derrick Quinn, male    DOB: 04/30/1970  Age: 52 y.o. MRN: [id number]  Chief Complaint  Patient presents with   Annual Exam    HPI  for complete physical and follow up of chronic conditions.   Anxiety/depression: currently on duloxetine and was referred to therapy.  Patient currently on duloxetine 30 mg daily.  H pylori: history of the same. States that he never took the medication. States that he has not picked up the medication.  Patient has a history of malnutrition could be secondary to history of H. pylori without treatment.  Immunizations: -Tetanus: Completed in 2015 -Influenza: did not get this season  -Shingles:  information given at office visit can get at a local pharmacy -Pneumonia: Too young  Diet: Fair diet. States that he would like to see a nutritionist. States that he tries to do three meals a day but normally always get 2 meals. Sometimes 1 meal a day. Some snack. Water Exercise: No regular exercise. [employer]. 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 [facility] [place]  Lung Cancer Screening: N/A  PSA: Due  Sleep: states that he goes to bed around 9-10 and will get up around 1-3am and be up and fall back to sleep. Then get up 5-6  and take son to school around 7. States he does not think he snores        Review of Systems  Constitutional:  Negative for chills and fever.  Respiratory:  Negative for shortness of breath.   Cardiovascular:  Negative for chest pain and leg swelling.  Gastrointestinal:  Negative for abdominal pain, blood in stool, constipation, diarrhea, nausea and vomiting.       BM several times a week   Genitourinary:  Negative for dysuria and hematuria.  Neurological:  Negative for dizziness, tingling and headaches.   Psychiatric/Behavioral:  Negative for hallucinations and suicidal ideas.       Objective:     BP 100/62   Pulse 65   Temp 98.4 F (36.9 C)   Resp 16   Ht 5\' 9"  (1.753 m)   Wt 111 lb 4 oz (50.5 kg)   SpO2 99%   BMI 16.43 kg/m  BP Readings from Last 3 Encounters:  06/30/22 100/62  05/29/22 (!) 84/60  01/26/22 90/60   Wt Readings from Last 3 Encounters:  06/30/22 111 lb 4 oz (50.5 kg)  05/29/22 109 lb (49.4 kg)  01/26/22 99 lb 6 oz (45.1 kg)      Physical Exam Vitals and nursing note reviewed. Exam conducted with a chaperone present Tresa Endo Fiscal, CMA).  Constitutional:      Appearance: Normal appearance.  HENT:     Right Ear: Tympanic membrane, ear canal and external ear normal.     Left Ear: Tympanic membrane, ear canal and external ear normal.     Mouth/Throat:     Mouth: Mucous membranes are moist.     Pharynx: Oropharynx is clear.  Eyes:     Extraocular Movements: Extraocular movements intact.     Pupils: Pupils are equal, round, and reactive to light.  Cardiovascular:     Rate and Rhythm: Normal rate and regular rhythm.     Pulses: Normal pulses.     Heart sounds: Normal heart sounds.  Pulmonary:     Effort: Pulmonary effort is normal.     Breath sounds: Normal breath sounds.  Abdominal:     General: Bowel sounds are normal. There is no distension.     Palpations: There is no mass.     Tenderness: There is no abdominal tenderness.     Hernia: No hernia is present. There is no hernia in the left inguinal area or right inguinal area.  Genitourinary:    Penis: Normal.      Testes: Cremasteric reflex is present.        Right: Mass not present.        Left: Tenderness present. Mass not present.     Epididymis:     Right: Normal.     Left: Normal.  Musculoskeletal:     Right lower leg: No edema.     Left lower leg: No edema.  Lymphadenopathy:     Cervical: No cervical adenopathy.     Lower Body: No right inguinal adenopathy. No left inguinal  adenopathy.  Skin:    General: Skin is warm.  Neurological:     General: No focal deficit present.     Mental Status: He is alert.     Deep Tendon Reflexes:     Reflex Scores:      Bicep reflexes are 2+ on the right side and 2+ on the left side.      Patellar reflexes are 2+ on the right side and 2+ on the left side.    Comments: Bilateral upper and lower extremity strength 5/5  Psychiatric:        Mood and Affect: Mood normal.        Behavior: Behavior normal.        Thought Content: Thought content normal.        Judgment: Judgment normal.      No results found for any visits on 06/30/22.    The ASCVD Risk score (Arnett DK, et al., 2019) failed to calculate for the following reasons:   Cannot find a previous HDL lab   Cannot find a previous total cholesterol lab    Assessment & Plan:   Problem List Items Addressed This Visit       Digestive   GERD (gastroesophageal reflux disease)    Patient with omeprazole 20 mg.  Does have history of H. pylori never picked up antibiotic treatment.  Will retest for H. pylori continue omeprazole 20 mg daily        Other   History of Helicobacter pylori infection    History of the same did not pick up treatment in the past.  Pending stool test to see if still active H. pylori.      Relevant Orders   Helicobacter Pylori Special Antigen, Stool   Anxiety and depression    History of the same on duloxetine 30 mg.  Continue medication as prescribed patient denies HI/SI/AVH.      Malnutrition of moderate degree    Patient has gained weight since last office visit.  Will have a follow-up fairly close in 3 months to recheck weight to make sure he is still gaining if patient continues to gain we will continue to monitor if not sent to nutritionist.      Relevant Orders   TSH   Screening for STD (sexually transmitted disease) - Primary    In the setting of left testicular pain with benign exam and no stable sexual partner with history  of unprotected sex  screen for STIs.  Pending results      Relevant Orders   Urine cytology ancillary only   HIV antibody (with reflex)   Pain in left testicle    Screening for STIs no concern for torsion or epididymitis in office.  This could be positional for patient states it does come and go without no injury      Relevant Orders   Urine cytology ancillary only   Other Visit Diagnoses     Screening for lipid disorders       Relevant Orders   Lipid panel   Screening for prostate cancer       Relevant Orders   PSA       Return in about 3 months (around 09/29/2022) for weight recheck .    Audria Nine, NP

## 2022-06-30 NOTE — Patient Instructions (Signed)
Nice to see you today I will be in touch with the labs I want to see you in 3 months to check in on your weight Follow up sooner if you need me  Consider getting your shingles vaccine at the local pharmacy

## 2022-06-30 NOTE — Assessment & Plan Note (Signed)
History of the same did not pick up treatment in the past.  Pending stool test to see if still active H. pylori.

## 2022-07-01 LAB — URINE CYTOLOGY ANCILLARY ONLY
Chlamydia: NEGATIVE
Comment: NEGATIVE
Comment: NEGATIVE
Comment: NORMAL
Neisseria Gonorrhea: NEGATIVE
Trichomonas: NEGATIVE

## 2022-07-06 ENCOUNTER — Other Ambulatory Visit: Payer: No Typology Code available for payment source

## 2022-07-07 ENCOUNTER — Other Ambulatory Visit: Payer: No Typology Code available for payment source

## 2022-07-08 ENCOUNTER — Other Ambulatory Visit: Payer: No Typology Code available for payment source

## 2022-07-08 ENCOUNTER — Ambulatory Visit
Admission: EM | Admit: 2022-07-08 | Discharge: 2022-07-08 | Disposition: A | Discharge status: Home / Self Care | Payer: No Typology Code available for payment source

## 2022-07-08 DIAGNOSIS — F411 Generalized anxiety disorder: Secondary | ICD-10-CM

## 2022-07-08 NOTE — ED Provider Notes (Signed)
[name]    CSN: [id number] Arrival date & time: 07/08/22  4098      History   Chief Complaint No chief complaint on file.   HPI Christepher Tsuchiya is a 52 y.o. male.   HPI  Presents to urgent care to request vital sign check.  He is concerned about possible high blood pressure.  States he is having a panic attack.  Past Medical History:  Diagnosis Date   Anxiety    Chicken pox    Depression     Patient Active Problem List   Diagnosis Date Noted   Screening for STD (sexually transmitted disease) 06/30/2022   Pain in left testicle 06/30/2022   Palpitations 01/26/2022   GERD (gastroesophageal reflux disease) 01/26/2022   Malnutrition of moderate degree (HCC) 01/26/2022   Lumbar pain 07/09/2021   History of Helicobacter pylori infection 07/09/2021   Anxiety and depression 07/09/2021   Left knee pain 06/03/2021   Right shoulder pain 06/03/2021    Past Surgical History:  Procedure Laterality Date   APPENDECTOMY     COLONOSCOPY  09/2018   ESOPHAGOGASTRODUODENOSCOPY     2020       Home Medications    Prior to Admission medications   Medication Sig Start Date End Date Taking? Authorizing Provider  ALPRAZolam (XANAX) 0.25 MG tablet Take 1 tablet (0.25 mg total) by mouth 2 (two) times daily as needed for anxiety. 05/29/22  Yes Karie Schwalbe, MD  cyclobenzaprine (FLEXERIL) 10 MG tablet Take 1 tablet (10 mg total) by mouth at bedtime. 10/29/21  Yes White, Adrienne R, NP  DULoxetine (CYMBALTA) 30 MG capsule Take 1 capsule (30 mg total) by mouth daily. 01/26/22  Yes Karie Schwalbe, MD  hydrOXYzine (ATARAX) 50 MG tablet Take 1 tablet (50 mg total) by mouth every 6 (six) hours as needed for anxiety. May take 2 tabs at night 08/06/21  Yes Domenick Gong, MD  meloxicam (MOBIC) 7.5 MG tablet Take 1 tablet (7.5 mg total) by mouth daily. 10/29/21  Yes White, Elita Boone, NP  omeprazole (PRILOSEC) 20 MG capsule Take 1 capsule (20 mg total) by mouth in the morning  and at bedtime. On empty stomach 01/26/22  Yes Karie Schwalbe, MD    Family History Family History  Problem Relation Age of Onset   Cancer Mother    Mental illness Sister    Diabetes Paternal Grandmother    Hypertension Paternal Grandfather     Social History Social History   Tobacco Use   Smoking status: Never   Smokeless tobacco: Never  Vaping Use   Vaping Use: Never used  Substance Use Topics   Alcohol use: Not Currently    Comment: two times per year   Drug use: Never     Allergies   Patient has no known allergies.   Review of Systems Review of Systems   Physical Exam Triage Vital Signs ED Triage Vitals  Enc Vitals Group     BP 07/08/22 1028 131/86     Pulse Rate 07/08/22 1028 77     Resp 07/08/22 1028 18     Temp 07/08/22 1028 99 F (37.2 C)     Temp Source 07/08/22 1028 Oral     SpO2 07/08/22 1028 95 %     Weight --      Height --      Head Circumference --      Peak Flow --      Pain Score 07/08/22 1038 0  Pain Loc --      Pain Edu? --      Excl. in GC? --    No data found.  Updated Vital Signs BP 131/86 (BP Location: Right Arm)   Pulse 77   Temp 99 F (37.2 C) (Oral)   Resp 18   SpO2 95%   Visual Acuity Right Eye Distance:   Left Eye Distance:   Bilateral Distance:    Right Eye Near:   Left Eye Near:    Bilateral Near:     Physical Exam Vitals reviewed.  Constitutional:      Appearance: Normal appearance. He is not ill-appearing.  Cardiovascular:     Rate and Rhythm: Normal rate and regular rhythm.     Pulses: Normal pulses.     Heart sounds: Normal heart sounds.  Pulmonary:     Effort: Pulmonary effort is normal.     Breath sounds: Normal breath sounds.  Skin:    General: Skin is warm and dry.  Neurological:     General: No focal deficit present.     Mental Status: He is alert and oriented to person, place, and time.  Psychiatric:        Mood and Affect: Mood normal.        Behavior: Behavior normal.       UC Treatments / Results  Labs (all labs ordered are listed, but only abnormal results are displayed) Labs Reviewed - No data to display  EKG   Radiology No results found.  Procedures Procedures (including critical care time)  Medications Ordered in UC Medications - No data to display  Initial Impression / Assessment and Plan / UC Course  I have reviewed the triage vital signs and the nursing notes.  Pertinent labs & imaging results that were available during my care of the patient were reviewed by me and considered in my medical decision making (see chart for details).   Josealfredo Kleinpeter is a 52 y.o. male presenting with panic attack. Patient is afebrile without recent antipyretics, satting well on room air. Overall is well appearing though non-toxic, well hydrated, without respiratory distress. Pulmonary exam is unremarkable.  Lungs CTAB without wheezing, rhonchi, rales.  Patient states that his doctor spoke to him while waiting evaluation and he is feeling better.  Vital signs are WNL.  Patient is discharged without further treatment.  Final Clinical Impressions(s) / UC Diagnoses   Final diagnoses:  None   Discharge Instructions   None    ED Prescriptions   None    PDMP not reviewed this encounter.   Charma Igo, [place] 07/08/22 1139

## 2022-07-08 NOTE — Discharge Instructions (Signed)
Follow up here or with your primary care provider if your symptoms are worsening or not improving.     

## 2022-07-08 NOTE — ED Triage Notes (Signed)
Pt wanted to be seen to have his vitals checked, pt does state he had a panic attack today but did call his doctor and spoke with them.

## 2022-08-19 ENCOUNTER — Other Ambulatory Visit: Payer: No Typology Code available for payment source

## 2022-08-25 ENCOUNTER — Ambulatory Visit (HOSPITAL_COMMUNITY): Payer: Medicaid Other | Admitting: Licensed Clinical Social Worker

## 2022-09-04 ENCOUNTER — Encounter: Payer: Self-pay | Admitting: Family Medicine

## 2022-09-04 ENCOUNTER — Ambulatory Visit (INDEPENDENT_AMBULATORY_CARE_PROVIDER_SITE_OTHER): Payer: No Typology Code available for payment source | Admitting: Family Medicine

## 2022-09-04 ENCOUNTER — Telehealth: Payer: Self-pay

## 2022-09-04 VITALS — BP 90/64 | HR 84 | Temp 97.6°F | Ht 69.0 in | Wt 108.4 lb

## 2022-09-04 DIAGNOSIS — Z8619 Personal history of other infectious and parasitic diseases: Secondary | ICD-10-CM

## 2022-09-04 DIAGNOSIS — I959 Hypotension, unspecified: Secondary | ICD-10-CM | POA: Insufficient documentation

## 2022-09-04 DIAGNOSIS — R1013 Epigastric pain: Secondary | ICD-10-CM | POA: Diagnosis not present

## 2022-09-04 NOTE — Telephone Encounter (Signed)
I spoke with pt; pt said after he eats he has a lot of gas and is concerned about h pylori. No abd pain and no diarrhea or fever. Pt request to be seen today.Pt scheduled appt with Dr Ermalene Searing 09/04/22 at 2:40. Sending note to Dr Ermalene Searing and [place] pool.

## 2022-09-04 NOTE — Progress Notes (Signed)
Patient ID: Derrick Quinn, male    DOB: [birth year], 52 y.o.   MRN: [id number]  This visit was conducted in person.  BP 90/64 (BP Location: Left Arm, Patient Position: Sitting, Cuff Size: Normal)   Pulse 84   Temp 97.6 F (36.4 C) (Temporal)   Ht 5\' 9"  (1.753 m)   Wt 108 lb 6 oz (49.2 kg)   SpO2 99%   BMI 16.00 kg/m    CC:  Chief Complaint  Patient presents with   Gas    After eating-Concerned about H Pylori     Subjective:   HPI: Derrick Quinn is a 52 y.o. male patient of Matt cable, NP with history of H. pylori infection and GERD presenting on 09/04/2022 for Gas (After eating-Concerned about H Pylori/)  He reports  he has noted  fluctuance every time he eats pizza.  Reflux well controlled  occ using prilosec, occ some regurgitation of food and reflux after eating pizza.  No stomach pain.   Last used PPI 2 weeks ago.   No blood in stool , no diarrhea, occ constipation.   Hx of Hpylori in 2020.Derrick Quinn positive test noted on chart in 08/06/2021     He has not been drinking much water today. BP Readings from Last 3 Encounters:  09/04/22 90/64  07/08/22 131/86  06/30/22 100/62     Relevant past medical, surgical, family and social history reviewed and updated as indicated. Interim medical history since our last visit reviewed. Allergies and medications reviewed and updated. Outpatient Medications Prior to Visit  Medication Sig Dispense Refill   ALPRAZolam (XANAX) 0.25 MG tablet Take 1 tablet (0.25 mg total) by mouth 2 (two) times daily as needed for anxiety. 20 tablet 0   cyclobenzaprine (FLEXERIL) 10 MG tablet Take 1 tablet (10 mg total) by mouth at bedtime. 10 tablet 0   DULoxetine (CYMBALTA) 30 MG capsule Take 1 capsule (30 mg total) by mouth daily. 30 capsule 1   hydrOXYzine (ATARAX) 50 MG tablet Take 1 tablet (50 mg total) by mouth every 6 (six) hours as needed for anxiety. May take 2 tabs at night 30 tablet 1   meloxicam (MOBIC) 7.5 MG tablet Take 1 tablet (7.5 mg  total) by mouth daily. 30 tablet 0   omeprazole (PRILOSEC) 20 MG capsule Take 1 capsule (20 mg total) by mouth in the morning and at bedtime. On empty stomach 28 capsule 0   No facility-administered medications prior to visit.     Per HPI unless specifically indicated in ROS section below Review of Systems  Constitutional:  Negative for fatigue and fever.  HENT:  Negative for ear pain.   Eyes:  Negative for pain.  Respiratory:  Negative for cough and shortness of breath.   Cardiovascular:  Negative for chest pain, palpitations and leg swelling.  Gastrointestinal:  Negative for abdominal pain.  Genitourinary:  Negative for dysuria.  Musculoskeletal:  Negative for arthralgias.  Neurological:  Negative for syncope, light-headedness and headaches.  Psychiatric/Behavioral:  Negative for dysphoric mood.    Objective:  BP 90/64 (BP Location: Left Arm, Patient Position: Sitting, Cuff Size: Normal)   Pulse 84   Temp 97.6 F (36.4 C) (Temporal)   Ht 5\' 9"  (1.753 m)   Wt 108 lb 6 oz (49.2 kg)   SpO2 99%   BMI 16.00 kg/m   Wt Readings from Last 3 Encounters:  09/04/22 108 lb 6 oz (49.2 kg)  06/30/22 111 lb 4 oz (50.5 kg)  05/29/22 109  lb (49.4 kg)      Physical Exam Constitutional:      Appearance: He is well-developed and underweight. He is not ill-appearing.  HENT:     Head: Normocephalic.     Right Ear: Hearing normal.     Left Ear: Hearing normal.     Nose: Nose normal.  Neck:     Thyroid: No thyroid mass or thyromegaly.     Vascular: No carotid bruit.     Trachea: Trachea normal.  Cardiovascular:     Rate and Rhythm: Normal rate and regular rhythm.     Pulses: Normal pulses.     Heart sounds: Heart sounds not distant. No murmur heard.    No friction rub. No gallop.     Comments: No peripheral edema Pulmonary:     Effort: Pulmonary effort is normal. No respiratory distress.     Breath sounds: Normal breath sounds.  Abdominal:     General: Abdomen is flat.      Tenderness: There is abdominal tenderness in the epigastric area.  Skin:    General: Skin is warm and dry.     Findings: No rash.  Psychiatric:        Speech: Speech normal.        Behavior: Behavior normal.        Thought Content: Thought content normal.       Results for orders placed or performed in visit on 06/30/22  Urine cytology ancillary only  Result Value Ref Range   Neisseria Gonorrhea Negative    Chlamydia Negative    Trichomonas Negative    Comment Normal Reference Range Trichomonas - Negative    Comment Normal Reference Ranger Chlamydia - Negative    Comment      Normal Reference Range Neisseria Gonorrhea - Negative    Assessment and Plan  Epigastric pain -     H. pylori breath test  History of Helicobacter pylori infection -     H. pylori breath test  Hypotension, unspecified hypotension type Assessment & Plan: Acute, noted low blood pressure in office today.  Patient not lightheaded.  He reports he is only had limited liquids and p.o. intake today.  He has been outside in the 90 degree weather.  Encouraged him to drink more water closer to 48 to 64 ounces a day.     No follow-ups on file.   Kerby Nora, MD

## 2022-09-04 NOTE — Assessment & Plan Note (Signed)
Acute, noted low blood pressure in office today.  Patient not lightheaded.  He reports he is only had limited liquids and p.o. intake today.  He has been outside in the 90 degree weather.  Encouraged him to drink more water closer to 48 to 64 ounces a day.

## 2022-09-04 NOTE — Telephone Encounter (Signed)
Noted  

## 2022-09-07 ENCOUNTER — Other Ambulatory Visit: Payer: Self-pay | Admitting: Radiology

## 2022-09-07 DIAGNOSIS — R1013 Epigastric pain: Secondary | ICD-10-CM

## 2022-09-07 DIAGNOSIS — Z8619 Personal history of other infectious and parasitic diseases: Secondary | ICD-10-CM

## 2022-09-08 ENCOUNTER — Other Ambulatory Visit: Payer: Self-pay | Admitting: Family Medicine

## 2022-09-08 LAB — HELICOBACTER PYLORI  SPECIAL ANTIGEN
MICRO NUMBER:: 15147214
RESULT:: DETECTED — AB
SPECIMEN QUALITY: ADEQUATE

## 2022-09-08 MED ORDER — PANTOPRAZOLE SODIUM 40 MG PO TBEC: 40.0000 mg | DELAYED_RELEASE_TABLET | Freq: Every day | ORAL | 0 refills | Status: DC

## 2022-09-08 MED ORDER — AMOXICILLIN 500 MG PO CAPS: 1000.0000 mg | ORAL_CAPSULE | Freq: Two times a day (BID) | ORAL | 0 refills | Status: DC

## 2022-09-08 MED ORDER — CLARITHROMYCIN 500 MG PO TABS: 500.0000 mg | ORAL_TABLET | Freq: Two times a day (BID) | ORAL | 0 refills | Status: DC

## 2022-10-08 ENCOUNTER — Ambulatory Visit
Admission: EM | Admit: 2022-10-08 | Discharge: 2022-10-08 | Disposition: A | Discharge status: Home / Self Care | Payer: Medicaid Other | Attending: Emergency Medicine | Admitting: Emergency Medicine

## 2022-10-08 DIAGNOSIS — B349 Viral infection, unspecified: Secondary | ICD-10-CM | POA: Diagnosis not present

## 2022-10-08 DIAGNOSIS — Z1152 Encounter for screening for COVID-19: Secondary | ICD-10-CM | POA: Diagnosis not present

## 2022-10-08 NOTE — ED Triage Notes (Signed)
Patient to Urgent Care with complaints of headache that he woke up with this morning.  Thinks he might be congested. Left sided ear pain. Uses ear buds. Denies any known fevers.  Denies any known sick contacts.   No home medications taken.

## 2022-10-08 NOTE — Discharge Instructions (Addendum)
Most likely you have a viral illness: no antibiotic as indicated at this time, May treat with OTC meds of choice. Make sure to drink plenty of fluids to stay hydrated(gatorade, water, popsicles,jello,etc), avoid caffeine products. Follow up with PCP. Return as needed.  MyChart for results.

## 2022-10-08 NOTE — ED Provider Notes (Signed)
Derrick Quinn    CSN: [id number] Arrival date & time: 10/08/22  1919      History   Chief Complaint Chief Complaint  Patient presents with   Headache    HPI Derrick Quinn is a 52 y.o. male.   52 year old male, Derrick Quinn, presents to urgent care for headache that started this morning, patient thinks he may be congested also complaining of left-sided ear pain, denies any known fevers or sick contacts, no meds taken prior to arrival " I do not take meds like that".  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  Denies smoking, drinking, or drug use.   PMH: Anxiety    The history is provided by the patient. No language interpreter was used.    Past Medical History:  Diagnosis Date   Anxiety    Chicken pox    Depression     Patient Active Problem List   Diagnosis Date Noted   Nonspecific syndrome suggestive of viral illness 10/08/2022   Hypotension 09/04/2022   Screening for STD (sexually transmitted disease) 06/30/2022   Pain in left testicle 06/30/2022   Palpitations 01/26/2022   GERD (gastroesophageal reflux disease) 01/26/2022   Malnutrition of moderate degree (HCC) 01/26/2022   Lumbar pain 07/09/2021   History of Helicobacter pylori infection 07/09/2021   Anxiety and depression 07/09/2021   Left knee pain 06/03/2021   Right shoulder pain 06/03/2021    Past Surgical History:  Procedure Laterality Date   APPENDECTOMY     COLONOSCOPY  09/2018   ESOPHAGOGASTRODUODENOSCOPY     2020       Home Medications    Prior to Admission medications   Medication Sig Start Date End Date Taking? Authorizing Provider  ALPRAZolam (XANAX) 0.25 MG tablet Take 1 tablet (0.25 mg total) by mouth 2 (two) times daily as needed for anxiety. 05/29/22   Karie Schwalbe, MD  amoxicillin (AMOXIL) 500 MG capsule Take 2 capsules (1,000 mg  total) by mouth 2 (two) times daily. Patient not taking: Reported on 10/08/2022 09/08/22   Excell Seltzer, MD  clarithromycin (BIAXIN) 500 MG tablet Take 1 tablet (500 mg total) by mouth 2 (two) times daily. Patient not taking: Reported on 10/08/2022 09/08/22   Excell Seltzer, MD  cyclobenzaprine (FLEXERIL) 10 MG tablet Take 1 tablet (10 mg total) by mouth at bedtime. 10/29/21   White, Elita Boone, NP  DULoxetine (CYMBALTA) 30 MG capsule Take 1 capsule (30 mg total) by mouth daily. 01/26/22   Karie Schwalbe, MD  hydrOXYzine (ATARAX) 50 MG tablet Take 1 tablet (50 mg total) by mouth every 6 (six) hours as needed for anxiety. May take 2 tabs at night 08/06/21   Domenick Gong, MD  meloxicam (MOBIC) 7.5 MG tablet Take 1 tablet (7.5 mg total) by mouth daily. 10/29/21   White, Elita Boone, NP  pantoprazole (PROTONIX) 40 MG tablet Take 1 tablet (40 mg total) by mouth daily. 09/08/22   Excell Seltzer, MD    Family History Family History  Problem Relation Age of Onset   Cancer Mother    Mental illness Sister    Diabetes Paternal Grandmother    Hypertension Paternal Grandfather     Social History Social History   Tobacco Use   Smoking status: Never   Smokeless tobacco: Never  Vaping Use   Vaping  status: Never Used  Substance Use Topics   Alcohol use: Not Currently    Comment: two times per year   Drug use: Never     Allergies   Patient has no known allergies.   Review of Systems Review of Systems  All other systems reviewed and are negative.    Physical Exam Triage Vital Signs ED Triage Vitals  Encounter Vitals Group     BP 10/08/22 1924 131/82     Systolic BP Percentile --      Diastolic BP Percentile --      Pulse Rate 10/08/22 1924 78     Resp 10/08/22 1924 18     Temp 10/08/22 1924 97.9 F (36.6 C)     Temp src --      SpO2 10/08/22 1924 98 %     Weight --      Height --      Head Circumference --      Peak Flow --      Pain Score 10/08/22 1926 5     Pain Loc --       Pain Education --      Exclude from Growth Chart --    No data found.  Updated Vital Signs BP 131/82   Pulse 78   Temp 97.9 F (36.6 C)   Resp 18   SpO2 98%   Visual Acuity Right Eye Distance:   Left Eye Distance:   Bilateral Distance:    Right Eye Near:   Left Eye Near:    Bilateral Near:     Physical Exam Vitals and nursing note reviewed.  Constitutional:      General: He is not in acute distress.    Appearance: He is well-developed.  HENT:     Head: Normocephalic and atraumatic.  Eyes:     Conjunctiva/sclera: Conjunctivae normal.  Cardiovascular:     Rate and Rhythm: Normal rate and regular rhythm.     Heart sounds: No murmur heard. Pulmonary:     Effort: Pulmonary effort is normal. No respiratory distress.     Breath sounds: Normal breath sounds.  Abdominal:     Palpations: Abdomen is soft.     Tenderness: There is no abdominal tenderness.  Musculoskeletal:        General: No swelling.     Cervical back: Neck supple.  Skin:    General: Skin is warm and dry.     Capillary Refill: Capillary refill takes less than 2 seconds.  Neurological:     General: No focal deficit present.     Mental Status: He is alert and oriented to person, place, and time.     GCS: GCS eye subscore is 4. GCS verbal subscore is 5. GCS motor subscore is 6.  Psychiatric:        Attention and Perception: Attention normal.        Mood and Affect: Mood normal.        Speech: Speech normal.        Behavior: Behavior normal. Behavior is cooperative.        Thought Content: Thought content normal.      UC Treatments / Results  Labs (all labs ordered are listed, but only abnormal results are displayed) Labs Reviewed  SARS CORONAVIRUS 2 (TAT 6-24 HRS)    EKG   Radiology No results found.  Procedures Procedures (including critical care time)  Medications Ordered in UC Medications - No data to display  Initial Impression / Assessment  and Plan / UC Course  I have reviewed  the triage vital signs and the nursing notes.  Pertinent labs & imaging results that were available during my care of the patient were reviewed by me and considered in my medical decision making (see chart for details).     Ddx: Viral illness, allergies,anxiety Final Clinical Impressions(s) / UC Diagnoses   Final diagnoses:  Nonspecific syndrome suggestive of viral illness     Discharge Instructions      Most likely you have a viral illness: no antibiotic as indicated at this time, May treat with OTC meds of choice. Make sure to drink plenty of fluids to stay hydrated(gatorade, water, popsicles,jello,etc), avoid caffeine products. Follow up with PCP. Return as needed.  MyChart for results.     ED Prescriptions   None    PDMP not reviewed this encounter.   Clancy Gourd, NP 10/08/22 [phone]

## 2022-10-21 ENCOUNTER — Ambulatory Visit: Payer: No Typology Code available for payment source | Admitting: Nurse Practitioner

## 2022-10-21 ENCOUNTER — Encounter: Payer: Self-pay | Admitting: Nurse Practitioner

## 2022-10-21 VITALS — BP 122/80 | HR 74 | Temp 98.8°F | Ht 69.0 in | Wt 113.8 lb

## 2022-10-21 DIAGNOSIS — E440 Moderate protein-calorie malnutrition: Secondary | ICD-10-CM

## 2022-10-21 DIAGNOSIS — R0789 Other chest pain: Secondary | ICD-10-CM | POA: Diagnosis not present

## 2022-10-21 DIAGNOSIS — Z113 Encounter for screening for infections with a predominantly sexual mode of transmission: Secondary | ICD-10-CM

## 2022-10-21 DIAGNOSIS — M25512 Pain in left shoulder: Secondary | ICD-10-CM | POA: Diagnosis not present

## 2022-10-21 DIAGNOSIS — Z0000 Encounter for general adult medical examination without abnormal findings: Secondary | ICD-10-CM

## 2022-10-21 DIAGNOSIS — Z13220 Encounter for screening for lipoid disorders: Secondary | ICD-10-CM

## 2022-10-21 DIAGNOSIS — Z125 Encounter for screening for malignant neoplasm of prostate: Secondary | ICD-10-CM

## 2022-10-21 DIAGNOSIS — R002 Palpitations: Secondary | ICD-10-CM

## 2022-10-21 NOTE — Assessment & Plan Note (Signed)
Was able to reproduce chest pain in office albeit a different type of discomfort per patient report.  Low cardiac risk no other accompanying symptoms patient abusing that shoulder quite a bit the day before

## 2022-10-21 NOTE — Patient Instructions (Addendum)
Nice to see you today I will be in touch with the labs once I have reviewed them Pick up the treatment for the h pylori at the pharmacy   You will need to recheck your stool 2-4 weeks after you have completed the medication for the h pylori
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
## 2022-12-17 ENCOUNTER — Telehealth: Payer: Self-pay | Admitting: Nurse Practitioner

## 2022-12-17 NOTE — Telephone Encounter (Signed)
Patient dropped off document DMV, to be filled out by provider. Patient requested to send it back via Call Patient to pick up within 5-days. Document is located in providers tray at front office.Please advise at Mobile [phone] (mobile)

## 2022-12-21 ENCOUNTER — Other Ambulatory Visit: Payer: Self-pay | Admitting: Internal Medicine

## 2022-12-21 NOTE — Telephone Encounter (Signed)
I have not seen this form have you ?

## 2022-12-22 NOTE — Telephone Encounter (Signed)
I do have the form in my inbox. From what I remember the patient does not need a handicap placard

## 2022-12-23 NOTE — Telephone Encounter (Signed)
Called patient states that he has issues with his knee. States that he has been seen for this in the past. I have set up for office visit for evaluation.

## 2022-12-28 ENCOUNTER — Ambulatory Visit: Payer: No Typology Code available for payment source | Admitting: Nurse Practitioner

## 2022-12-30 ENCOUNTER — Ambulatory Visit: Payer: No Typology Code available for payment source | Admitting: Nurse Practitioner

## 2022-12-31 ENCOUNTER — Ambulatory Visit: Payer: No Typology Code available for payment source | Admitting: Nurse Practitioner

## 2022-12-31 ENCOUNTER — Ambulatory Visit
Admission: RE | Admit: 2022-12-31 | Discharge: 2022-12-31 | Disposition: A | Discharge status: Home / Self Care | Payer: No Typology Code available for payment source | Source: Ambulatory Visit | Attending: Nurse Practitioner | Admitting: Nurse Practitioner

## 2022-12-31 ENCOUNTER — Encounter: Payer: Self-pay | Admitting: Nurse Practitioner

## 2022-12-31 VITALS — BP 142/88 | HR 73 | Temp 98.1°F | Ht 69.0 in | Wt 122.6 lb

## 2022-12-31 DIAGNOSIS — G8929 Other chronic pain: Secondary | ICD-10-CM | POA: Insufficient documentation

## 2022-12-31 DIAGNOSIS — M25512 Pain in left shoulder: Secondary | ICD-10-CM | POA: Diagnosis not present

## 2022-12-31 DIAGNOSIS — M25562 Pain in left knee: Secondary | ICD-10-CM | POA: Diagnosis not present
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
## 2022-12-31 NOTE — Patient Instructions (Signed)
Nice to see you today I will be in touch with the xray once I have reviewed it I have referred you to ortho in [place] for a specialists opinion

## 2022-12-31 NOTE — Assessment & Plan Note (Signed)
Ambulatory referral to orthopedist today.  Did give patient some shoulder impingement exercises to do as long as it does not make the pain worse.  Continue doing over-the-counter analgesics as needed
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
## 2023-02-26 ENCOUNTER — Ambulatory Visit
Admission: EM | Admit: 2023-02-26 | Discharge: 2023-02-26 | Disposition: A | Discharge status: Home / Self Care | Payer: Medicaid Other | Attending: Emergency Medicine | Admitting: Emergency Medicine

## 2023-02-26 ENCOUNTER — Ambulatory Visit (INDEPENDENT_AMBULATORY_CARE_PROVIDER_SITE_OTHER): Payer: No Typology Code available for payment source

## 2023-02-26 ENCOUNTER — Telehealth: Payer: Self-pay | Admitting: Emergency Medicine

## 2023-02-26 DIAGNOSIS — M25511 Pain in right shoulder: Secondary | ICD-10-CM

## 2023-02-26 MED ORDER — PREDNISONE 20 MG PO TABS: 40.0000 mg | ORAL_TABLET | Freq: Every day | ORAL | 0 refills | Status: DC

## 2023-02-26 NOTE — ED Provider Notes (Signed)
[name]    CSN: [id number] Arrival date & time: 02/26/23  1037      History   Chief Complaint Chief Complaint  Patient presents with   Shoulder Pain    HPI Derrick Quinn is a 52 y.o. male.   Patient presents for evaluation of right-sided shoulder pain present for 5 days.  Primarily affecting the anterior aspect.  Pain does not radiate, has been constant, exacerbated with this lifted above the head.  Associated numbness.  Denies injury or trauma.  Endorses that he has had similar symptoms to the left side which was noted to be arthritis by his primary doctor.  Has attempted use of over-the-counter medications and Flexeril which has been minimally effective.  Past Medical History:  Diagnosis Date   Anxiety    Chicken pox    Depression     Patient Active Problem List   Diagnosis Date Noted   Chronic left shoulder pain 12/31/2022   Chest wall pain 10/21/2022   Nonspecific syndrome suggestive of viral illness 10/08/2022   Hypotension 09/04/2022   Screening for STD (sexually transmitted disease) 06/30/2022   Pain in left testicle 06/30/2022   Palpitations 01/26/2022   GERD (gastroesophageal reflux disease) 01/26/2022   Malnutrition of moderate degree (HCC) 01/26/2022   Lumbar pain 07/09/2021   History of Helicobacter pylori infection 07/09/2021   Anxiety and depression 07/09/2021   Left knee pain 06/03/2021   Right shoulder pain 06/03/2021    Past Surgical History:  Procedure Laterality Date   APPENDECTOMY     COLONOSCOPY  09/2018   ESOPHAGOGASTRODUODENOSCOPY     2020       Home Medications    Prior to Admission medications   Medication Sig Start Date End Date Taking? Authorizing Provider  cyclobenzaprine (FLEXERIL) 10 MG tablet Take 1 tablet (10 mg total) by mouth at bedtime. 10/29/21  Yes Kenon Delashmit R, NP  meloxicam (MOBIC) 7.5 MG tablet Take 1 tablet (7.5 mg total) by mouth daily. 10/29/21  Yes Sally Menard R, NP  pantoprazole  (PROTONIX) 40 MG tablet Take 1 tablet (40 mg total) by mouth daily. 09/08/22  Yes Bedsole, Amy E, MD  predniSONE (DELTASONE) 20 MG tablet Take 2 tablets (40 mg total) by mouth daily. 02/26/23  Yes Kire Ferg, Elita Boone, NP  ALPRAZolam (XANAX) 0.25 MG tablet Take 1 tablet (0.25 mg total) by mouth 2 (two) times daily as needed for anxiety. Patient not taking: Reported on 12/31/2022 05/29/22   Karie Schwalbe, MD  chlorhexidine (PERIDEX) 0.12 % solution SMARTSIG:By Mouth 12/02/22   [provider]  clarithromycin (BIAXIN) 500 MG tablet Take 1 tablet (500 mg total) by mouth 2 (two) times daily. 09/08/22   Bedsole, Amy E, MD  DULoxetine (CYMBALTA) 30 MG capsule Take 1 capsule (30 mg total) by mouth daily. 01/26/22   Karie Schwalbe, MD  hydrOXYzine (ATARAX) 50 MG tablet Take 1 tablet (50 mg total) by mouth every 6 (six) hours as needed for anxiety. May take 2 tabs at night 08/06/21   Domenick Gong, MD    Family History Family History  Problem Relation Age of Onset   Cancer Mother    Mental illness Sister    Diabetes Paternal Aunt    Diabetes Paternal Grandmother    Hypertension Paternal Grandfather     Social History Social History   Tobacco Use   Smoking status: Never   Smokeless tobacco: Never  Vaping Use   Vaping status: Never Used  Substance Use Topics  Alcohol use: Not Currently    Comment: two times per year   Drug use: Never     Allergies   Patient has no known allergies.   Review of Systems Review of Systems   Physical Exam Triage Vital Signs ED Triage Vitals [02/26/23 1115]  Encounter Vitals Group     BP 129/85     Systolic BP Percentile      Diastolic BP Percentile      Pulse Rate 71     Resp 18     Temp 98.3 F (36.8 C)     Temp Source Oral     SpO2 98 %     Weight      Height      Head Circumference      Peak Flow      Pain Score 7     Pain Loc      Pain Education      Exclude from Growth Chart    No data found.  Updated Vital Signs BP  129/85 (BP Location: Right Arm)   Pulse 71   Temp 98.3 F (36.8 C) (Oral)   Resp 18   SpO2 98%   Visual Acuity Right Eye Distance:   Left Eye Distance:   Bilateral Distance:    Right Eye Near:   Left Eye Near:    Bilateral Near:     Physical Exam Constitutional:      Appearance: Normal appearance.  Eyes:     Extraocular Movements: Extraocular movements intact.  Pulmonary:     Effort: Pulmonary effort is normal.  Musculoskeletal:     Comments: Tenderness present to the anterior and posterior aspects of the right shoulder without point tenderness, ecchymosis swelling or deformity,  limited range of motion only able to abduct his arm approximately 45% before pain is elicited, 2+ brachial pulse strength is a 4 out of 5  Neurological:     Mental Status: He is alert and oriented to person, place, and time. Mental status is at baseline.      UC Treatments / Results  Labs (all labs ordered are listed, but only abnormal results are displayed) Labs Reviewed - No data to display  EKG   Radiology No results found.  Procedures Procedures (including critical care time)  Medications Ordered in UC Medications - No data to display  Initial Impression / Assessment and Plan / UC Course  I have reviewed the triage vital signs and the nursing notes.  Pertinent labs & imaging results that were available during my care of the patient were reviewed by me and considered in my medical decision making (see chart for details).  Acute pain of right shoulder  Etiology muscular, x-ray pending, will notify via telephone, declined Toradol IM injection, prescribed prednisone and recommended additional supportive measures with follow-up with orthopedics if symptoms continue to persist or worsen, work note given Final Clinical Impressions(s) / UC Diagnoses   Final diagnoses:  Acute pain of right shoulder     Discharge Instructions      X-rays pending, you will be notified of results via  telephone  Begin prednisone every morning with food for 5 days to reduce internal inflammation which will help with pain, may use Tylenol and muscle relaxer additionally as well as any topical medicine  Use heat over the affected area in 10 to 15-minute intervals  You may continue activity as tolerated  If your symptoms continue to persist or worsen you may follow-up with your doctor, you  have also been given information to the orthopedic specialist   ED Prescriptions     Medication Sig Dispense Auth. Provider   predniSONE (DELTASONE) 20 MG tablet Take 2 tablets (40 mg total) by mouth daily. 10 tablet Valinda Hoar, NP      PDMP not reviewed this encounter.   Valinda Hoar, NP 02/26/23 1140

## 2023-02-26 NOTE — Discharge Instructions (Signed)
X-rays pending, you will be notified of results via telephone  Begin prednisone every morning with food for 5 days to reduce internal inflammation which will help with pain, may use Tylenol and muscle relaxer additionally as well as any topical medicine  Use heat over the affected area in 10 to 15-minute intervals  You may continue activity as tolerated  If your symptoms continue to persist or worsen you may follow-up with your doctor, you have also been given information to the orthopedic specialist

## 2023-02-26 NOTE — Telephone Encounter (Signed)
Attempted to report x-ray results x 1 via telephone, left voicemail to return call to clinic

## 2023-02-26 NOTE — ED Triage Notes (Signed)
Right shoulder pain x 5 days. No recent injury or fall on that side. Pt states it hurts when lifting his right arm up.

## 2023-02-27 ENCOUNTER — Telehealth: Payer: Self-pay | Admitting: Emergency Medicine

## 2023-02-27 NOTE — Telephone Encounter (Signed)
Reviewed x-ray results with patient, answered all questions at that time, advised continuation of current treatment plan with follow-up with PCP or orthopedics

## 2023-03-01 ENCOUNTER — Other Ambulatory Visit: Payer: Self-pay | Admitting: *Deleted

## 2023-03-01 NOTE — Telephone Encounter (Signed)
Copied from CRM [id number]. Topic: Clinical - Medication Refill >> Mar 01, 2023  3:42 PM Tiffany H wrote: Most Recent Primary Care Visit:  Provider: Eden Emms  Department: [facility]  Visit Type: OFFICE VISIT  Date: 12/31/2022  Medication: ALPRAZolam Prudy Feeler) 0.25 MG tablet  Has the patient contacted their pharmacy? Yes (Agent: If no, request that the patient contact the pharmacy for the refill. If patient does not wish to contact the pharmacy document the reason why and proceed with request.) (Agent: If yes, when and what did the pharmacy advise?)  Is this the correct pharmacy for this prescription? Yes If no, delete pharmacy and type the correct one.  This is the patient's preferred pharmacy:  [facility] [address] [facility] - [place], [place] - [address] [facility] [address] [facility] [address] [place] [address] Phone: [phone] Fax: [phone]   Has the prescription been filled recently? No  Is the patient out of the medication? Yes  Has the patient been seen for an appointment in the last year OR does the patient have an upcoming appointment? Yes  Can we respond through MyChart? No  Agent: Please be advised that Rx refills may take up to 3 business days. We ask that you follow-up with your pharmacy.

## 2023-03-11 ENCOUNTER — Telehealth: Payer: Self-pay

## 2023-03-11 NOTE — Telephone Encounter (Signed)
 Left message to return call to our office.

## 2023-03-11 NOTE — Telephone Encounter (Signed)
 Patient has never gotten labs drawn for 4 months ago. He needs to have those done to continue getting refills. Also last thing that we have recorded in the chart is that he is not taking that medication. Can we get clarification please

## 2023-03-11 NOTE — Telephone Encounter (Signed)
 Copied from CRM [id number]. Topic: Clinical - Prescription Issue >> Mar 09, 2023  3:52 PM Melissa C wrote: Reason for CRM: patient called to refill ALPRAZolam  (XANAX ) 0.25 MG tablet on 12/23. Patient hasn't gotten prescription or heard why or if there was a refusal. Agent sees a pending status on prescription but also possible refusal, however patient never got information as to why prescription would have been refused. He would like to be contacted regarding prescription issue.

## 2023-03-16 NOTE — Telephone Encounter (Signed)
 Called patient set up lab appointment. States he is taking the Xanax on average once a week. States he has been out for a "while" states he let us know when he was in office in November that he needed refill.

## 2023-03-17 ENCOUNTER — Telehealth: Payer: Self-pay | Admitting: Nurse Practitioner

## 2023-03-17 DIAGNOSIS — G8929 Other chronic pain: Secondary | ICD-10-CM

## 2023-03-17 NOTE — Telephone Encounter (Signed)
-----   Message from [facility] I sent at 03/16/2023  2:28 PM EST ----- Spoke with patient about his results. He is fine going to ortho but will need a referral and prefers going to [facility].

## 2023-03-18 ENCOUNTER — Other Ambulatory Visit: Payer: No Typology Code available for payment source

## 2023-03-19 NOTE — Telephone Encounter (Signed)
 Pt has been scheduled for lab visit 1/16 @9 :45.

## 2023-03-19 NOTE — Telephone Encounter (Signed)
 I dont see that patient made the lab appointment. I was waiting on the labs prior to refilling the medication

## 2023-03-25 ENCOUNTER — Other Ambulatory Visit: Payer: No Typology Code available for payment source

## 2023-04-05 ENCOUNTER — Other Ambulatory Visit: Payer: Self-pay

## 2023-04-05 ENCOUNTER — Encounter: Payer: Self-pay | Admitting: Emergency Medicine

## 2023-04-05 ENCOUNTER — Ambulatory Visit
Admission: EM | Admit: 2023-04-05 | Discharge: 2023-04-05 | Disposition: A | Discharge status: Home / Self Care | Payer: No Typology Code available for payment source | Attending: Emergency Medicine | Admitting: Emergency Medicine

## 2023-04-05 DIAGNOSIS — J069 Acute upper respiratory infection, unspecified: Secondary | ICD-10-CM | POA: Diagnosis not present

## 2023-04-05 LAB — POC COVID19/FLU A&B COMBO
Covid Antigen, POC: NEGATIVE
Influenza A Antigen, POC: NEGATIVE
Influenza B Antigen, POC: NEGATIVE

## 2023-04-05 MED ORDER — AZITHROMYCIN 250 MG PO TABS
250.0000 mg | ORAL_TABLET | Freq: Every day | ORAL | 0 refills | Status: DC
Start: 2023-04-05 — End: 2023-12-01

## 2023-04-05 MED ORDER — ONDANSETRON 4 MG PO TBDP: 4.0000 mg | ORAL_TABLET | Freq: Three times a day (TID) | ORAL | 0 refills | Status: DC | PRN

## 2023-04-05 NOTE — Discharge Instructions (Addendum)
COVID and flu test negative  Symptoms present for 6 days and you have begun to experience new symptoms such as vomiting therefore we will provide you with bacterial coverage  Take Augmentin as directed  You may use Zofran every 8 hours as needed to help with vomiting, place under tongue and allow to dissolve    You can take Tylenol and/or Ibuprofen as needed for fever reduction and pain relief.   For cough: honey 1/2 to 1 teaspoon (you can dilute the honey in water or another fluid).  You can also use guaifenesin and dextromethorphan for cough. You can use a humidifier for chest congestion and cough.  If you don't have a humidifier, you can sit in the bathroom with the hot shower running.      For sore throat: try warm salt water gargles, cepacol lozenges, throat spray, warm tea or water with lemon/honey, popsicles or ice, or OTC cold relief medicine for throat discomfort.   For congestion: take a daily anti-histamine like Zyrtec, Claritin, and a oral decongestant, such as pseudoephedrine.  You can also use Flonase 1-2 sprays in each nostril daily.   It is important to stay hydrated: drink plenty of fluids (water, gatorade/powerade/pedialyte, juices, or teas) to keep your throat moisturized and help further relieve irritation/discomfort.

## 2023-04-05 NOTE — ED Provider Notes (Signed)
[name]    CSN: [id number] Arrival date & time: 04/05/23  1039      History   Chief Complaint Chief Complaint  Patient presents with   Chills    HPI Tyrome Hasten is a 53 y.o. male.   Patient presents for evaluation of chills, body aches, fatigue and a nonproductive cough present for 6 days, experiencing vomiting beginning yesterday evening, has persisted to today.  Unable to tolerate food and liquids since.  Possible sick contact prior but unsure.  Has not attempted treatment.  Denies shortness of breath, wheezing, abdominal pain or diarrhea.  Past Medical History:  Diagnosis Date   Anxiety    Chicken pox    Depression     Patient Active Problem List   Diagnosis Date Noted   Chronic left shoulder pain 12/31/2022   Chest wall pain 10/21/2022   Nonspecific syndrome suggestive of viral illness 10/08/2022   Hypotension 09/04/2022   Screening for STD (sexually transmitted disease) 06/30/2022   Pain in left testicle 06/30/2022   Palpitations 01/26/2022   GERD (gastroesophageal reflux disease) 01/26/2022   Malnutrition of moderate degree (HCC) 01/26/2022   Lumbar pain 07/09/2021   History of Helicobacter pylori infection 07/09/2021   Anxiety and depression 07/09/2021   Left knee pain 06/03/2021   Right shoulder pain 06/03/2021    Past Surgical History:  Procedure Laterality Date   APPENDECTOMY     COLONOSCOPY  09/2018   ESOPHAGOGASTRODUODENOSCOPY     2020       Home Medications    Prior to Admission medications   Medication Sig Start Date End Date Taking? Authorizing Provider  azithromycin (ZITHROMAX) 250 MG tablet Take 1 tablet (250 mg total) by mouth daily. Take first 2 tablets together, then 1 every day until finished. 04/05/23  Yes Earsie Humm R, NP  ondansetron (ZOFRAN-ODT) 4 MG disintegrating tablet Take 1 tablet (4 mg total) by mouth every 8 (eight) hours as needed. 04/05/23  Yes Axavier Pressley, Elita Boone, NP  ALPRAZolam (XANAX) 0.25 MG tablet  Take 1 tablet (0.25 mg total) by mouth 2 (two) times daily as needed for anxiety. Patient not taking: Reported on 12/31/2022 05/29/22   Karie Schwalbe, MD  chlorhexidine (PERIDEX) 0.12 % solution SMARTSIG:By Mouth 12/02/22   [provider]  clarithromycin (BIAXIN) 500 MG tablet Take 1 tablet (500 mg total) by mouth 2 (two) times daily. Patient not taking: Reported on 04/05/2023 09/08/22   Excell Seltzer, MD  cyclobenzaprine (FLEXERIL) 10 MG tablet Take 1 tablet (10 mg total) by mouth at bedtime. 10/29/21   Lorelie Biermann, Elita Boone, NP  DULoxetine (CYMBALTA) 30 MG capsule Take 1 capsule (30 mg total) by mouth daily. 01/26/22   Karie Schwalbe, MD  hydrOXYzine (ATARAX) 50 MG tablet Take 1 tablet (50 mg total) by mouth every 6 (six) hours as needed for anxiety. May take 2 tabs at night 08/06/21   Domenick Gong, MD  meloxicam (MOBIC) 7.5 MG tablet Take 1 tablet (7.5 mg total) by mouth daily. 10/29/21   Ladawna Walgren, Elita Boone, NP  pantoprazole (PROTONIX) 40 MG tablet Take 1 tablet (40 mg total) by mouth daily. Patient not taking: Reported on 04/05/2023 09/08/22   Excell Seltzer, MD  predniSONE (DELTASONE) 20 MG tablet Take 2 tablets (40 mg total) by mouth daily. Patient not taking: Reported on 04/05/2023 02/26/23   Valinda Hoar, NP    Family History Family History  Problem Relation Age of Onset   Cancer Mother  Mental illness Sister    Diabetes Paternal Aunt    Diabetes Paternal Grandmother    Hypertension Paternal Grandfather     Social History Social History   Tobacco Use   Smoking status: Never   Smokeless tobacco: Never  Vaping Use   Vaping status: Never Used  Substance Use Topics   Alcohol use: Not Currently    Comment: two times per year   Drug use: Never     Allergies   Patient has no known allergies.   Review of Systems Review of Systems   Physical Exam Triage Vital Signs ED Triage Vitals  Encounter Vitals Group     BP 04/05/23 1222 112/84     Systolic BP  Percentile --      Diastolic BP Percentile --      Pulse Rate 04/05/23 1222 91     Resp 04/05/23 1222 20     Temp 04/05/23 1222 99.7 F (37.6 C)     Temp Source 04/05/23 1222 Oral     SpO2 04/05/23 1222 100 %     Weight --      Height --      Head Circumference --      Peak Flow --      Pain Score 04/05/23 1217 7     Pain Loc --      Pain Education --      Exclude from Growth Chart --    No data found.  Updated Vital Signs BP 112/84 (BP Location: Left Arm)   Pulse 91   Temp 99.7 F (37.6 C) (Oral)   Resp 20   SpO2 100%   Visual Acuity Right Eye Distance:   Left Eye Distance:   Bilateral Distance:    Right Eye Near:   Left Eye Near:    Bilateral Near:     Physical Exam Constitutional:      Appearance: Normal appearance.  HENT:     Right Ear: Tympanic membrane, ear canal and external ear normal.     Left Ear: Tympanic membrane, ear canal and external ear normal.     Nose: Congestion present. No rhinorrhea.     Mouth/Throat:     Pharynx: No oropharyngeal exudate or posterior oropharyngeal erythema.  Eyes:     Extraocular Movements: Extraocular movements intact.  Cardiovascular:     Rate and Rhythm: Normal rate and regular rhythm.     Pulses: Normal pulses.     Heart sounds: Normal heart sounds.  Pulmonary:     Effort: Pulmonary effort is normal.     Breath sounds: Normal breath sounds.  Abdominal:     General: Abdomen is flat. Bowel sounds are normal.     Palpations: Abdomen is soft.     Tenderness: There is abdominal tenderness in the epigastric area.  Neurological:     Mental Status: He is alert and oriented to person, place, and time. Mental status is at baseline.      UC Treatments / Results  Labs (all labs ordered are listed, but only abnormal results are displayed) Labs Reviewed  POC COVID19/FLU A&B COMBO - Normal    EKG   Radiology No results found.  Procedures Procedures (including critical care time)  Medications Ordered in  UC Medications - No data to display  Initial Impression / Assessment and Plan / UC Course  I have reviewed the triage vital signs and the nursing notes.  Pertinent labs & imaging results that were available during my care of the  patient were reviewed by me and considered in my medical decision making (see chart for details).  Acute URI  Patient is in no signs of distress nor toxic appearing.  Vital signs are stable.  Low suspicion for pneumonia, pneumothorax or bronchitis and therefore will defer imaging.  COVID and flu test negative.  Prescribed azithromycin and Zofran as symptoms have persisted for 6 days and have begun to worsen.May use additional over-the-counter medications as needed for supportive care.  May follow-up with urgent care as needed if symptoms persist or worsen.  Note given.   Final Clinical Impressions(s) / UC Diagnoses   Final diagnoses:  Acute URI     Discharge Instructions      COVID and flu test negative  Symptoms present for 6 days and you have begun to experience new symptoms such as vomiting therefore we will provide you with bacterial coverage  Take Augmentin as directed  You may use Zofran every 8 hours as needed to help with vomiting, place under tongue and allow to dissolve    You can take Tylenol and/or Ibuprofen as needed for fever reduction and pain relief.   For cough: honey 1/2 to 1 teaspoon (you can dilute the honey in water or another fluid).  You can also use guaifenesin and dextromethorphan for cough. You can use a humidifier for chest congestion and cough.  If you don't have a humidifier, you can sit in the bathroom with the hot shower running.      For sore throat: try warm salt water gargles, cepacol lozenges, throat spray, warm tea or water with lemon/honey, popsicles or ice, or OTC cold relief medicine for throat discomfort.   For congestion: take a daily anti-histamine like Zyrtec, Claritin, and a oral decongestant, such as  pseudoephedrine.  You can also use Flonase 1-2 sprays in each nostril daily.   It is important to stay hydrated: drink plenty of fluids (water, gatorade/powerade/pedialyte, juices, or teas) to keep your throat moisturized and help further relieve irritation/discomfort.    ED Prescriptions     Medication Sig Dispense Auth. Provider   azithromycin (ZITHROMAX) 250 MG tablet Take 1 tablet (250 mg total) by mouth daily. Take first 2 tablets together, then 1 every day until finished. 6 tablet Chermaine Schnyder R, NP   ondansetron (ZOFRAN-ODT) 4 MG disintegrating tablet Take 1 tablet (4 mg total) by mouth every 8 (eight) hours as needed. 20 tablet Valinda Hoar, NP      PDMP not reviewed this encounter.   Valinda Hoar, NP 04/05/23 1308

## 2023-04-05 NOTE — ED Triage Notes (Signed)
Started feeling bad last Tuesday or Wednesday.  Last night started vomiting.  Reports vomiting 3-4 times today.  Denies diarrhea.  complains of chills, body aches  Patient has been drinking a lot of water .  Last vomiting episode was 4:00 am. Has been drinking water since then and is able to hold it down

## 2023-04-07 ENCOUNTER — Ambulatory Visit (INDEPENDENT_AMBULATORY_CARE_PROVIDER_SITE_OTHER): Payer: No Typology Code available for payment source | Admitting: Orthopaedic Surgery

## 2023-04-07 ENCOUNTER — Encounter: Payer: Self-pay | Admitting: Orthopaedic Surgery

## 2023-04-07 ENCOUNTER — Other Ambulatory Visit (INDEPENDENT_AMBULATORY_CARE_PROVIDER_SITE_OTHER): Payer: No Typology Code available for payment source

## 2023-04-07 DIAGNOSIS — G8929 Other chronic pain: Secondary | ICD-10-CM

## 2023-04-07 DIAGNOSIS — M25562 Pain in left knee: Secondary | ICD-10-CM
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
## 2023-04-08 ENCOUNTER — Other Ambulatory Visit: Payer: Self-pay

## 2023-04-08 DIAGNOSIS — G8929 Other chronic pain: Secondary | ICD-10-CM

## 2023-04-20 ENCOUNTER — Encounter: Payer: Self-pay | Admitting: Orthopaedic Surgery

## 2023-04-23 ENCOUNTER — Ambulatory Visit
Admission: RE | Admit: 2023-04-23 | Discharge: 2023-04-23 | Disposition: A | Discharge status: Home / Self Care | Payer: No Typology Code available for payment source | Source: Ambulatory Visit | Attending: Orthopaedic Surgery | Admitting: Orthopaedic Surgery

## 2023-04-23 DIAGNOSIS — G8929 Other chronic pain: Secondary | ICD-10-CM

## 2023-04-28 IMAGING — DX DG KNEE COMPLETE 4+V*L*
5 series · 5 of 5 positions shown · non-contrast
Comparison: None.

CLINICAL DATA: Pain

EXAM:
LEFT KNEE - COMPLETE 4+ VIEW

[knee ap]
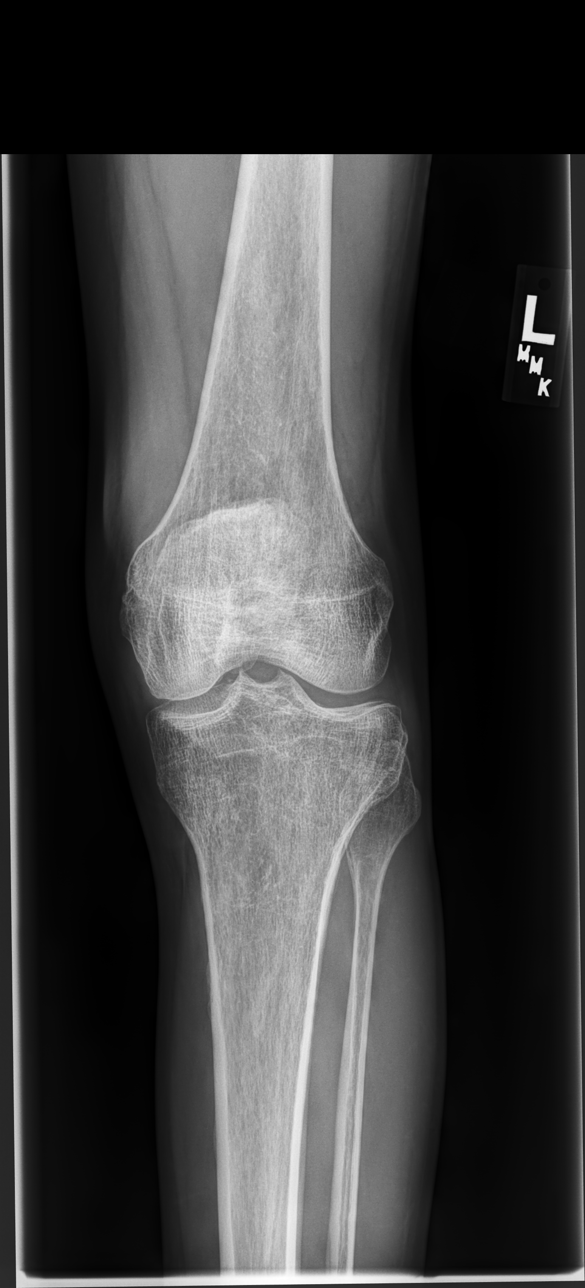

[knee lat]
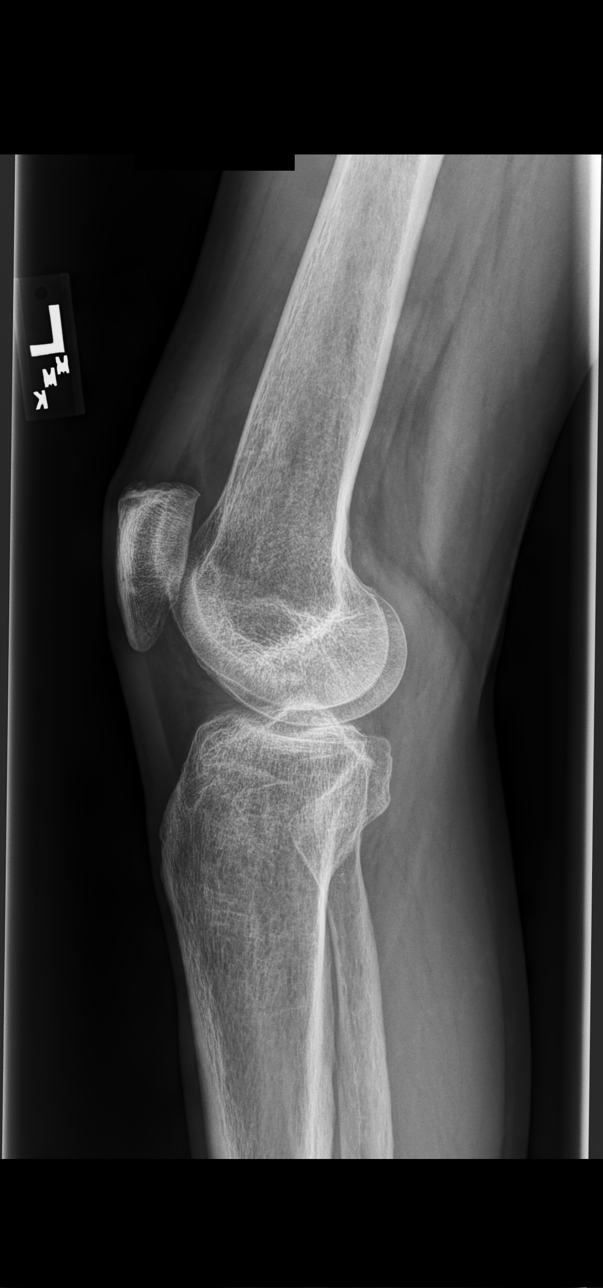

[knee [person_name] view pa]
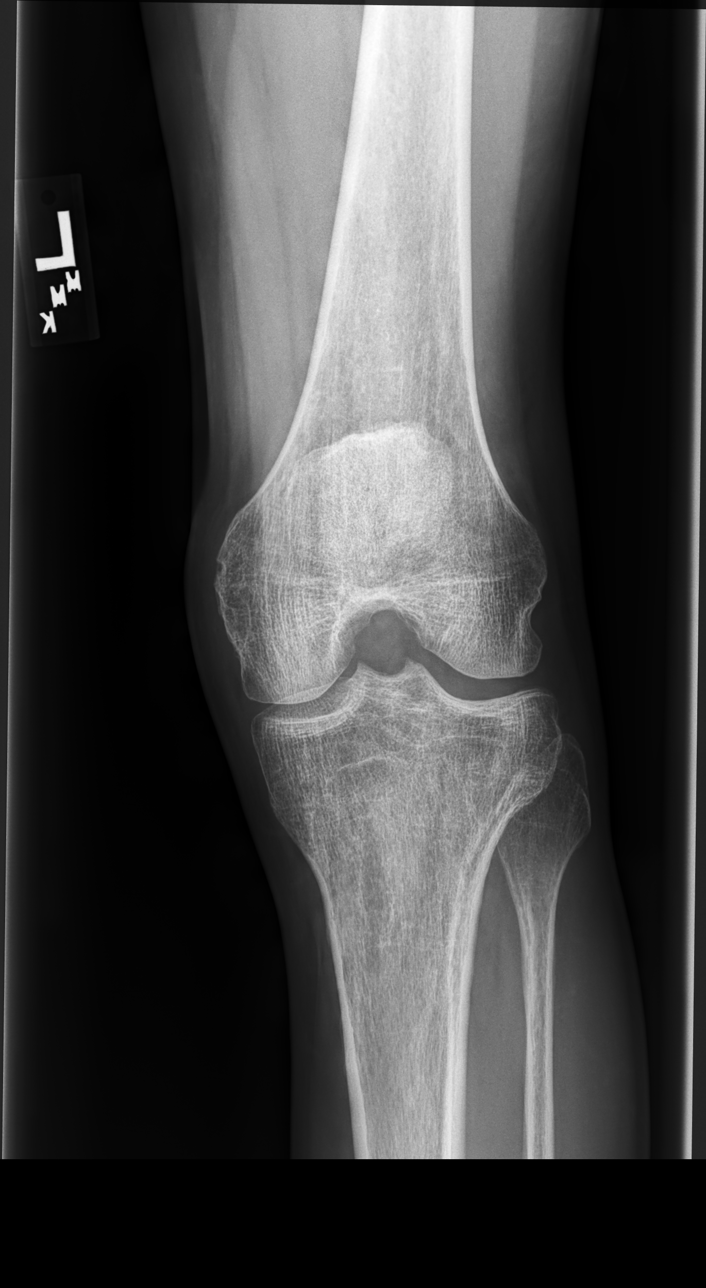

[patella (sunrise) tan (1 of 2)]
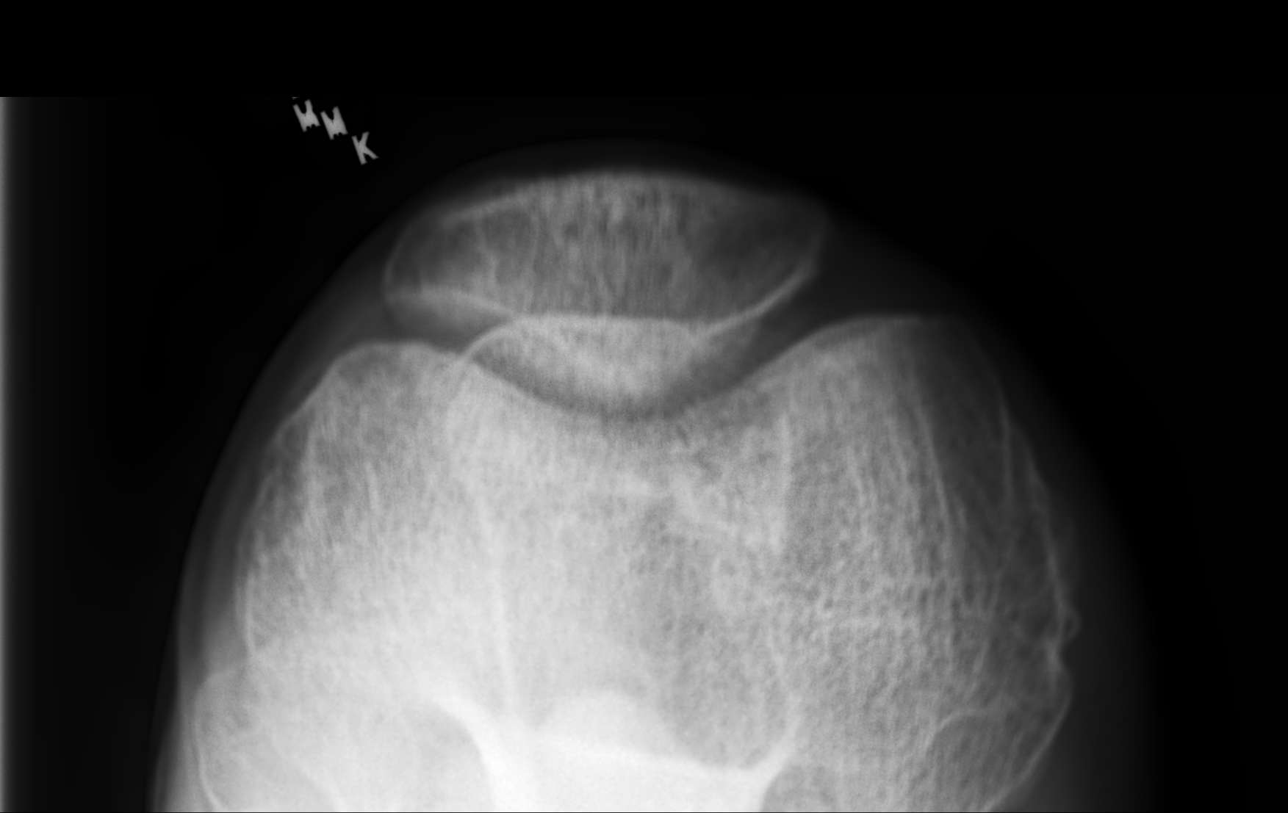

[patella (sunrise) tan (2 of 2)]
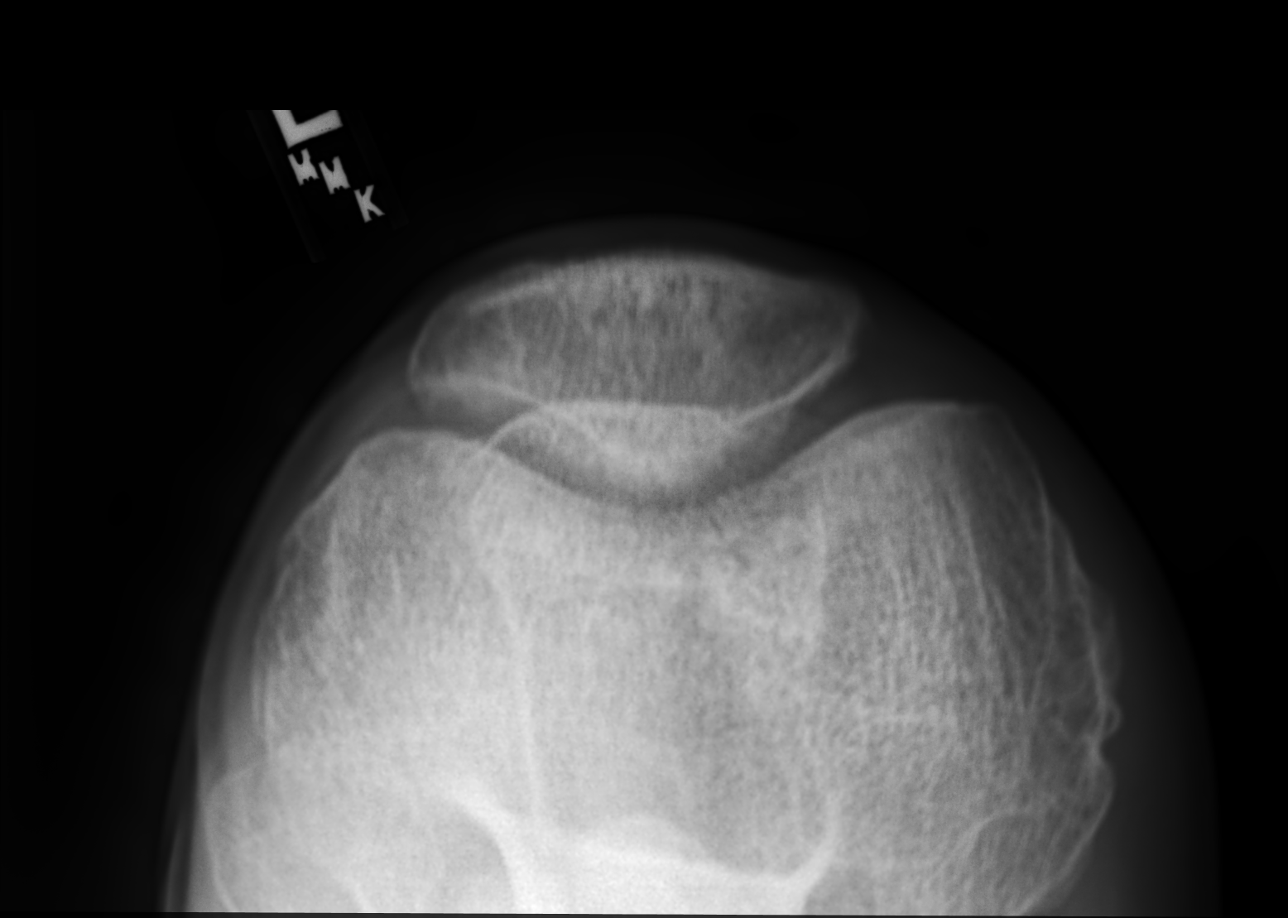

[5 of 5 positions shown; findings below may reference images not displayed]

FINDINGS: No recent fracture or dislocation is seen. There is no significant
effusion. Minimal bony spurs seen in the patella. Osteopenia is seen
in bony structures. No focal lytic lesions are seen.
IMPRESSION: No fracture or dislocation is seen. Minimal bony spurs seen in the
patella.

## 2023-04-28 IMAGING — DX DG SHOULDER 2+V*R*
3 series · 3 of 3 positions shown · non-contrast
Comparison: None.

CLINICAL DATA: Pain

EXAM:
RIGHT SHOULDER - 2+ VIEW

[shoulder (grashey view) ap]
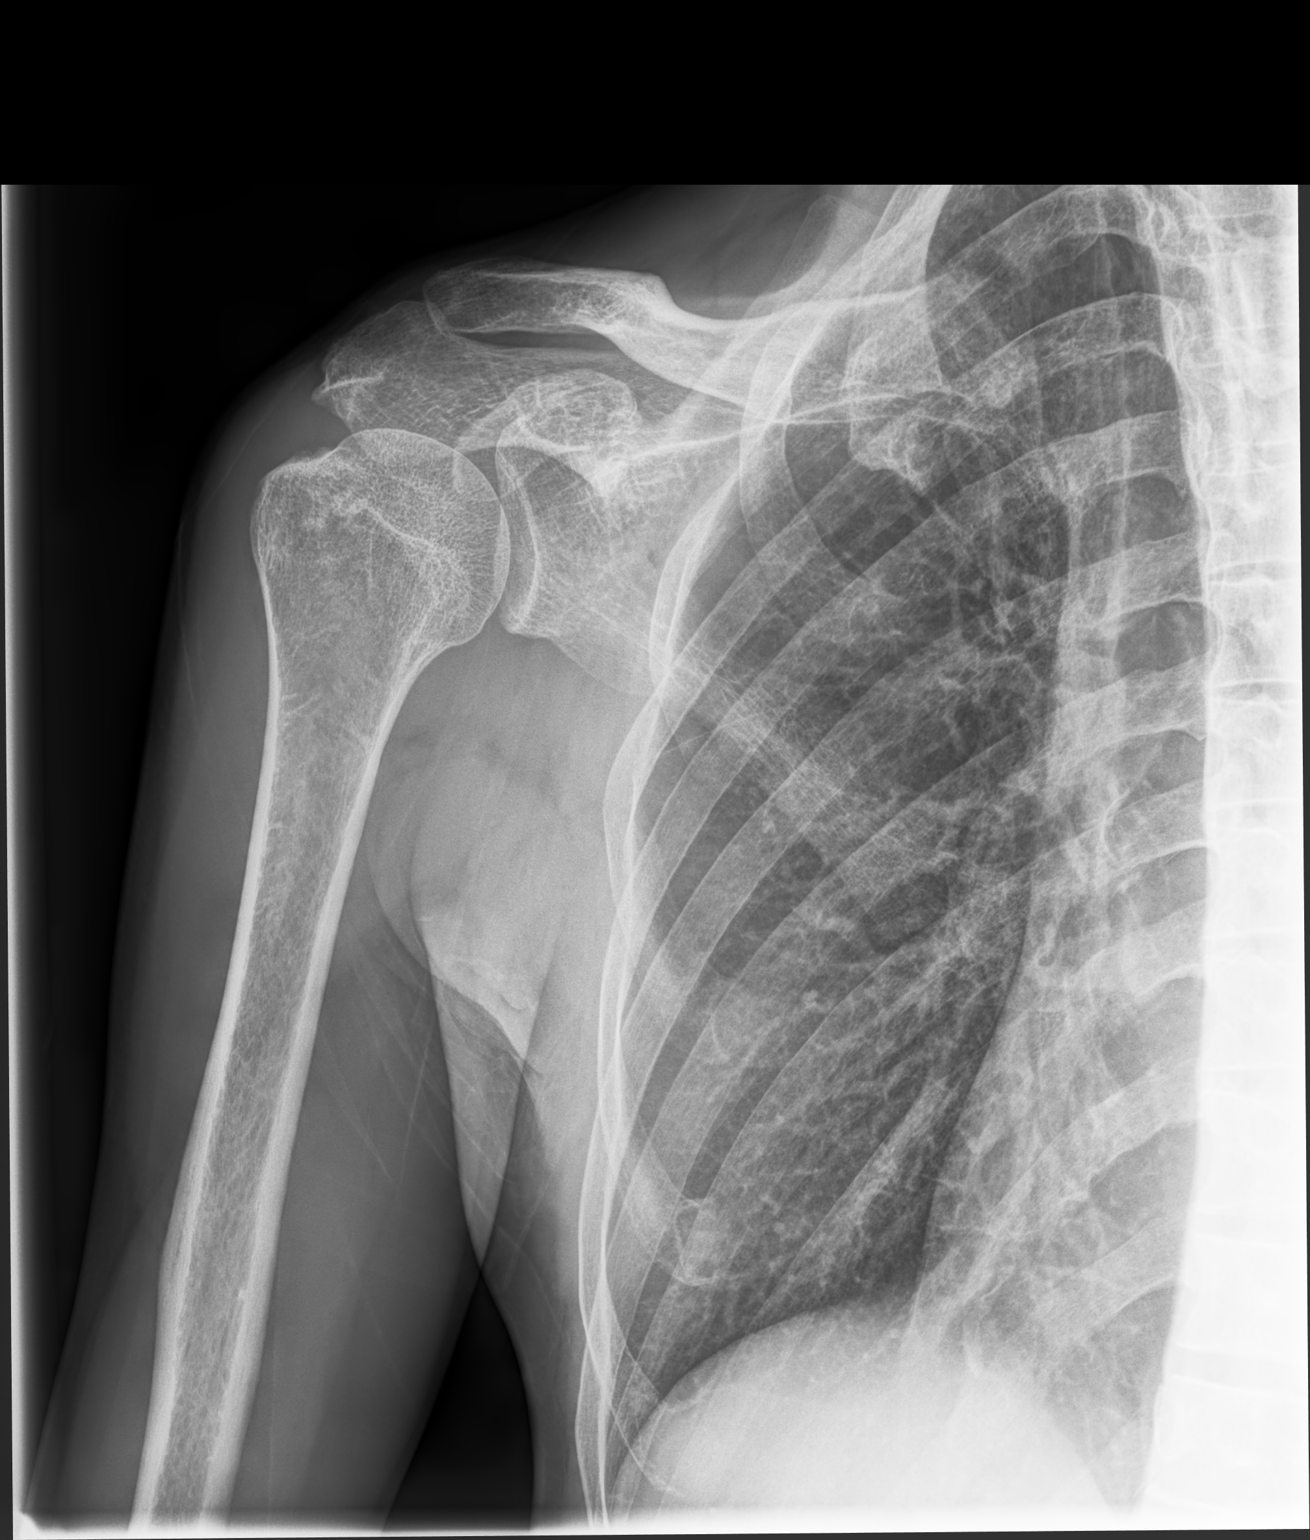

[shoulder (y view) lat]
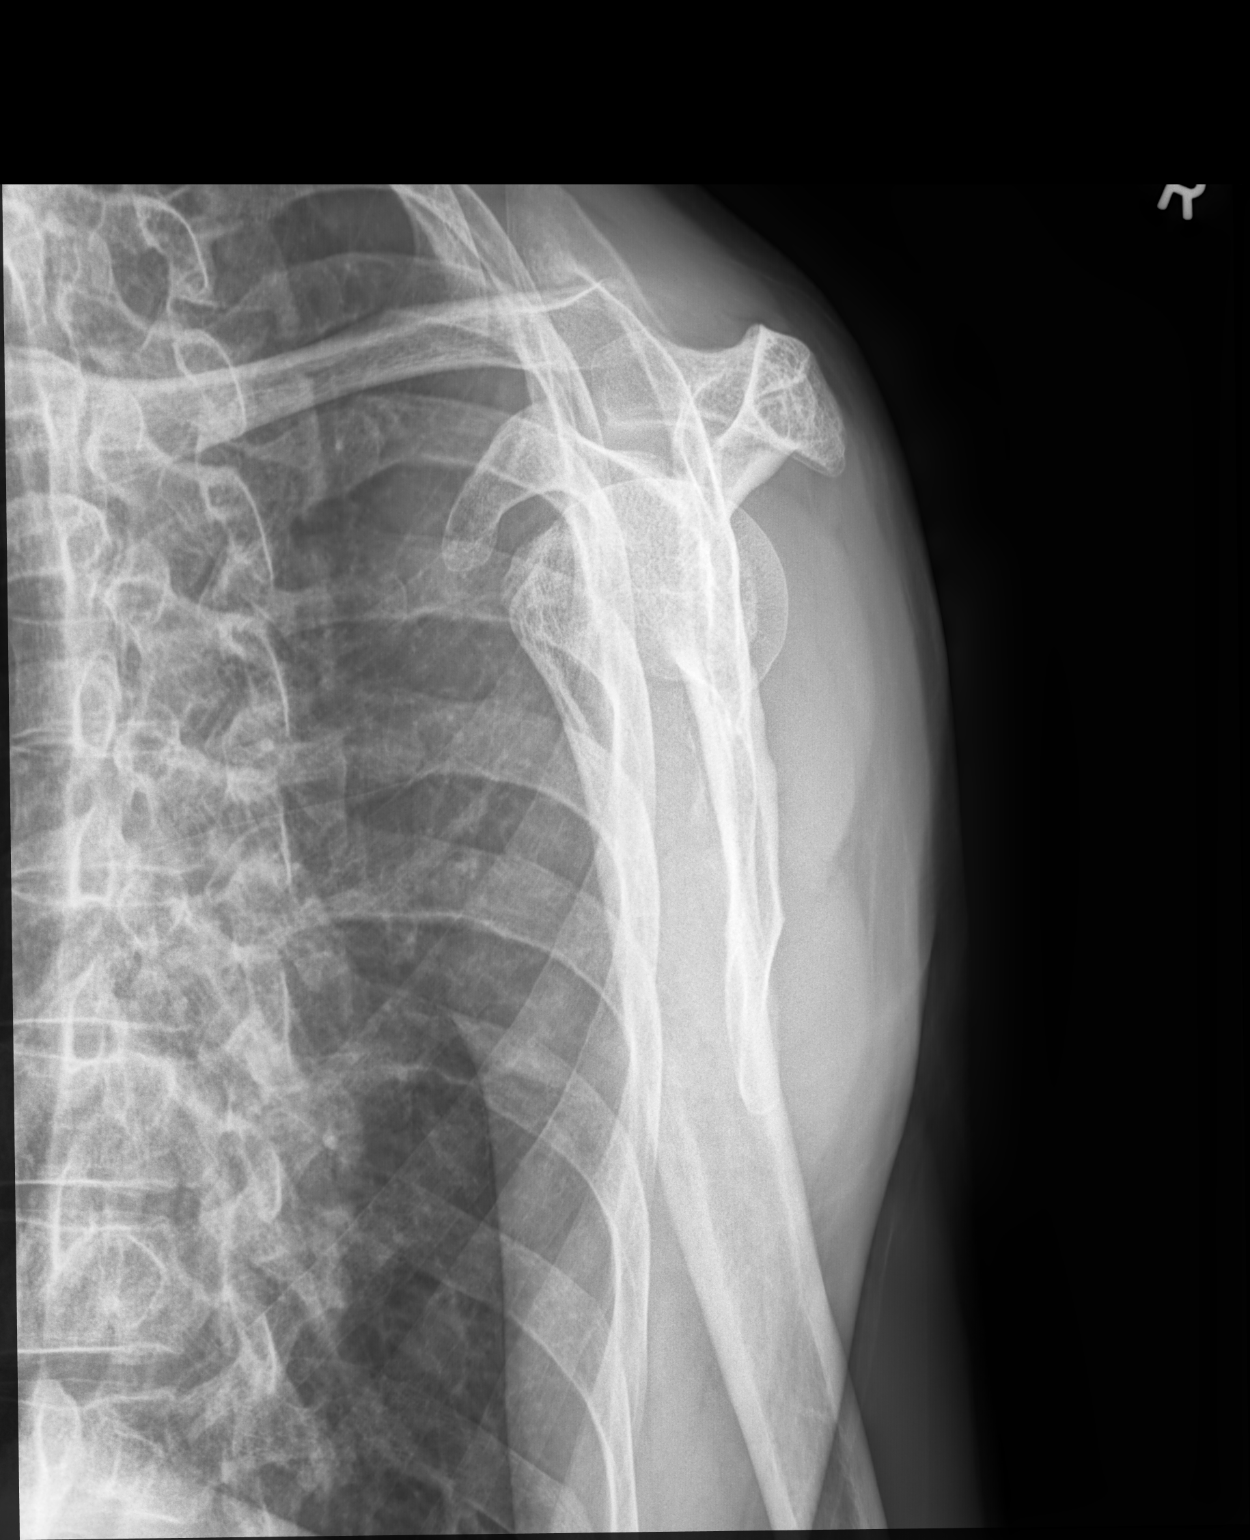

[shoulder axial]
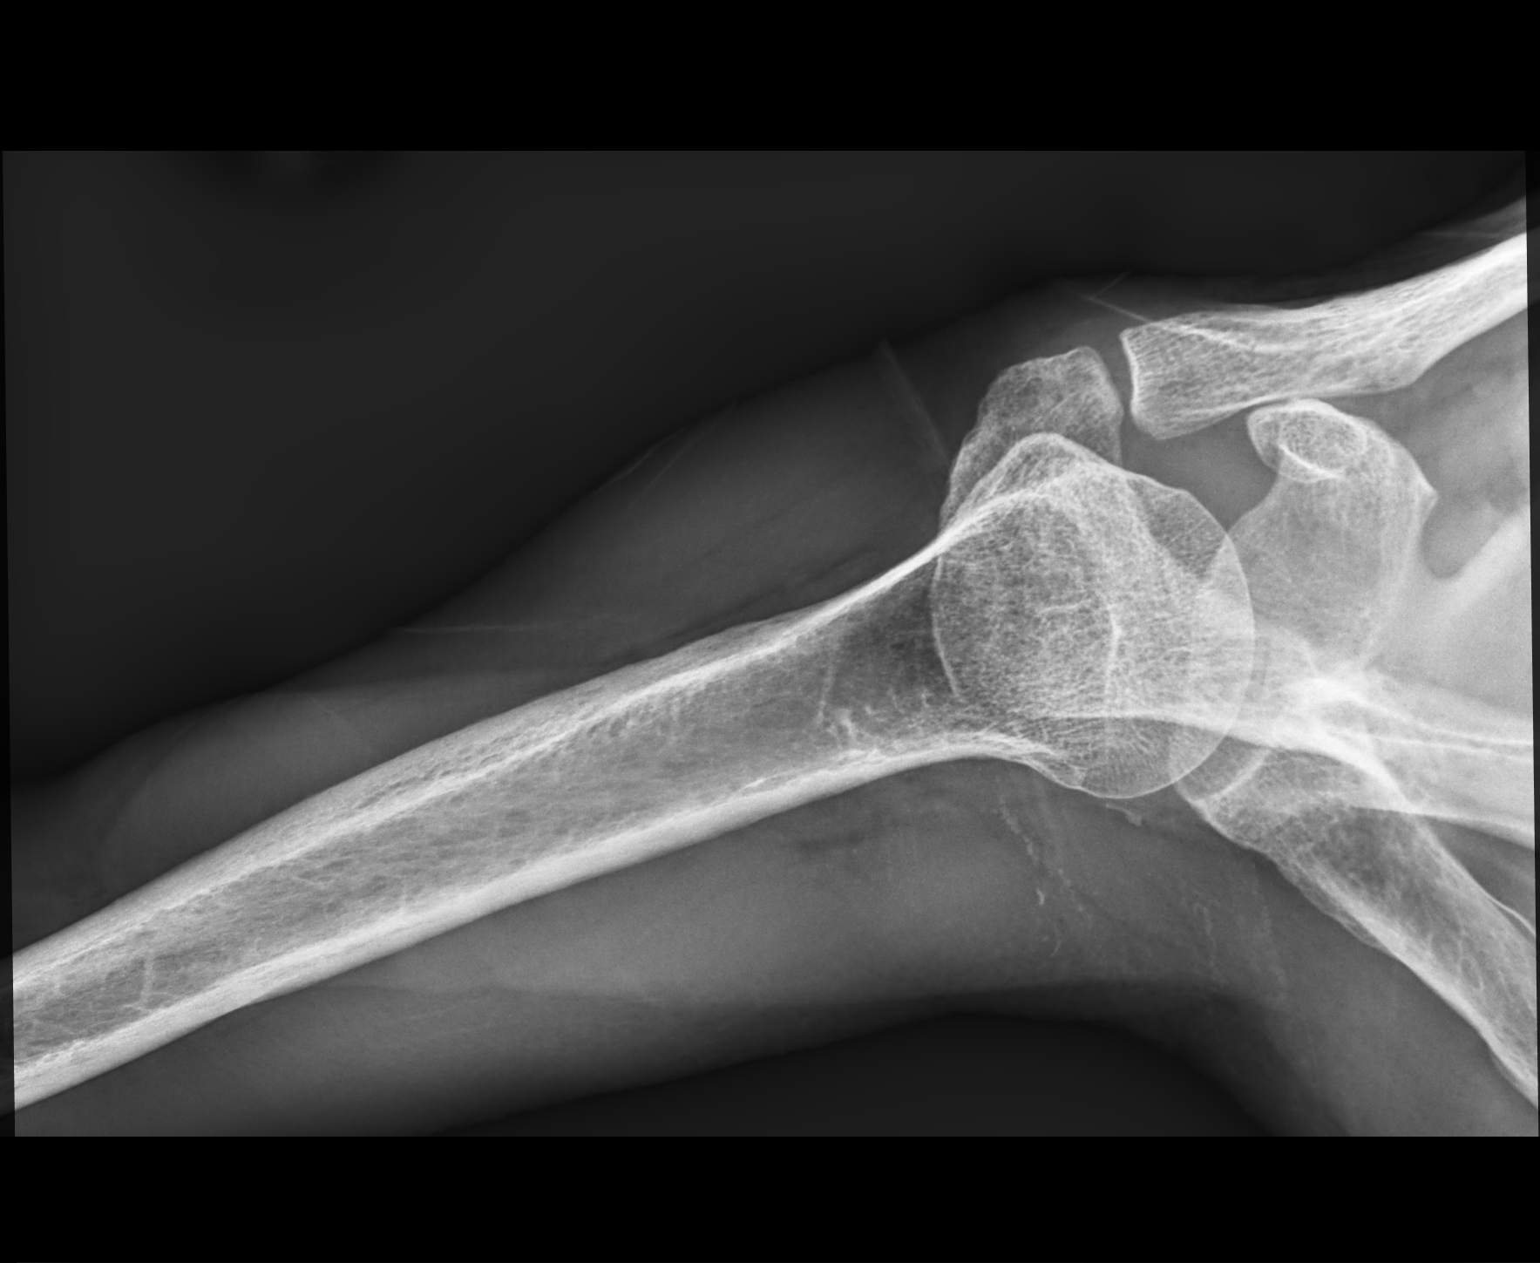

[3 of 3 positions shown; findings below may reference images not displayed]

FINDINGS: There is no evidence of fracture or dislocation. Osteopenia is seen
in bony structures. There are no focal lytic lesions. There is no
evidence of arthropathy or other focal bone abnormality. Soft
tissues are unremarkable.
IMPRESSION: No radiographic abnormality is seen in the right shoulder.

## 2023-07-07 ENCOUNTER — Ambulatory Visit: Admitting: Orthopaedic Surgery

## 2023-07-20 ENCOUNTER — Encounter: Payer: Self-pay | Admitting: Emergency Medicine

## 2023-07-20 ENCOUNTER — Emergency Department
Admission: EM | Admit: 2023-07-20 | Discharge: 2023-07-20 | Disposition: A | Discharge status: Home / Self Care | Attending: Emergency Medicine | Admitting: Emergency Medicine

## 2023-07-20 ENCOUNTER — Other Ambulatory Visit: Payer: Self-pay

## 2023-07-20 DIAGNOSIS — S70362A Insect bite (nonvenomous), left thigh, initial encounter: Secondary | ICD-10-CM | POA: Insufficient documentation

## 2023-07-20 DIAGNOSIS — W57XXXA Bitten or stung by nonvenomous insect and other nonvenomous arthropods, initial encounter: Secondary | ICD-10-CM | POA: Diagnosis not present

## 2023-07-20 MED ORDER — DOXYCYCLINE HYCLATE 100 MG PO TABS
200.0000 mg | ORAL_TABLET | Freq: Two times a day (BID) | ORAL | Status: DC
  Administered 2023-07-20: 200 mg via ORAL
  Filled 2023-07-20: qty 2

## 2023-07-20 NOTE — ED Provider Notes (Signed)
 [facility] Provider Note    Event Date/Time   First MD Initiated Contact with Patient 07/20/23 2237     (approximate)   History   Tick Removal   HPI  Derrick Quinn is a 53 y.o. male with PMH of anxiety and depression presents for tick removal.  Patient states he first noticed the tick on his left inner thigh today.  He is not sure how long it has been on his leg.  He does work outside.      Physical Exam   Triage Vital Signs: ED Triage Vitals  Encounter Vitals Group     BP 07/20/23 2209 (!) 139/93     Systolic BP Percentile --      Diastolic BP Percentile --      Pulse Rate 07/20/23 2209 72     Resp 07/20/23 2209 18     Temp 07/20/23 2209 98.1 F (36.7 C)     Temp Source 07/20/23 2209 Oral     SpO2 07/20/23 2209 100 %     Weight 07/20/23 2208 125 lb (56.7 kg)     Height 07/20/23 2208 5\' 10"  (1.778 m)     Head Circumference --      Peak Flow --      Pain Score --      Pain Loc --      Pain Education --      Exclude from Growth Chart --     Most recent vital signs: Vitals:   07/20/23 2209 07/20/23 2311  BP: (!) 139/93 (!) 132/90  Pulse: 72 70  Resp: 18 18  Temp: 98.1 F (36.7 C)   SpO2: 100% 100%   General: Awake, no distress.  CV:  Good peripheral perfusion.  Resp:  Normal effort.  Abd:  No distention.  Other:  Engorged tick on patient's left medial upper thigh.   ED Results / Procedures / Treatments   Labs (all labs ordered are listed, but only abnormal results are displayed) Labs Reviewed - No data to display   PROCEDURES:  Critical Care performed: No  Procedures   MEDICATIONS ORDERED IN ED: Medications  doxycycline (VIBRA-TABS) tablet 200 mg (200 mg Oral Given 07/20/23 2304)     IMPRESSION / MDM / ASSESSMENT AND PLAN / ED COURSE  I reviewed the triage vital signs and the nursing notes.                             53 year old male presents for tick removal.  Blood pressure slightly elevated otherwise  vital signs are stable.  Patient NAD on exam.  Differential diagnosis includes, but is not limited to, tick removal, tickborne illness prophylaxis.  Patient's presentation is most consistent with acute, uncomplicated illness.  Tick was removed using tweezers.  Patient was given a prophylactic dose of doxycycline.  We discussed checking himself for ticks after work each day.  We reviewed signs of Lyme disease.  Advised him to return to the ED with any worsening symptoms.  All questions were answered, he was stable at discharge.      FINAL CLINICAL IMPRESSION(S) / ED DIAGNOSES   Final diagnoses:  Tick bite with subsequent removal of tick     Rx / DC Orders   ED Discharge Orders     None        Note:  This document was prepared using Dragon voice recognition software and may include unintentional dictation errors.  Phyliss Breen, PA-C 07/20/23 2316    Twilla Galea, MD 07/23/23 1539

## 2023-07-20 NOTE — ED Notes (Signed)
 Pt expressing great anxiety about taking the doxycycline. He advised he has anxiety and is afraid that he will have a bad reaction form the medication. PT states he does not like taking medication but he is agreeing to take the antibiotic.

## 2023-07-20 NOTE — Discharge Instructions (Addendum)
 You were given 1 dose of 200 mg of doxycycline today.  This is a prophylactic treatment for tickborne illness.  Please check yourself daily for ticks.  Return to the emergency department with any worsening symptoms.

## 2023-07-20 NOTE — ED Triage Notes (Signed)
 Pt presents to the ED via POV with complaints of tick on his L inner thigh and "needs help removing it".  No draining nor redness around the area. He states it could have been there since Friday or early today. A&Ox4 at this time. Denies CP or SOB.

## 2023-07-28 ENCOUNTER — Ambulatory Visit: Admitting: Orthopaedic Surgery

## 2023-08-09 ENCOUNTER — Ambulatory Visit: Admitting: Physician Assistant

## 2023-09-30 ENCOUNTER — Encounter: Payer: Self-pay | Admitting: Emergency Medicine

## 2023-09-30 ENCOUNTER — Ambulatory Visit
Admission: EM | Admit: 2023-09-30 | Discharge: 2023-09-30 | Disposition: A | Discharge status: Home / Self Care | Attending: Emergency Medicine | Admitting: Emergency Medicine

## 2023-09-30 DIAGNOSIS — K0889 Other specified disorders of teeth and supporting structures: Secondary | ICD-10-CM | POA: Diagnosis not present

## 2023-09-30 DIAGNOSIS — R0789 Other chest pain: Secondary | ICD-10-CM

## 2023-09-30 MED ORDER — AMOXICILLIN-POT CLAVULANATE 875-125 MG PO TABS
1.0000 | ORAL_TABLET | Freq: Two times a day (BID) | ORAL | 0 refills | Status: DC
Start: 2023-09-30 — End: 2023-12-01

## 2023-09-30 MED ORDER — PREDNISONE 10 MG (21) PO TBPK
ORAL_TABLET | Freq: Every day | ORAL | 0 refills | Status: DC
Start: 2023-09-30 — End: 2023-12-01

## 2023-09-30 NOTE — Discharge Instructions (Signed)
 Your evaluated for your ear pain, on exam there is no abnormality to the ear but your lymph nodes are swollen and you know you are in need of dental pain, pain is most likely related to the teeth  Starting on antibiotics to treat any dental infection which should ideally help to resolve ear discomfort  Take Augmentin  twice daily for 7 days  You were evaluated today for chest tightness  On exam your lungs are clear and you are getting enough air without assistance  Begin prednisone  every morning with food as directed to relax the airway making it easier to breathe, this will also help with pain, avoid use of ibuprofen  but may take Tylenol  Avoid ear cleaning until symptoms resolve   Now warm compresses to the outer ear and to the neck  If your chest tightness continues please follow-up for reevaluation as this would not be related to a dental or ear infection

## 2023-09-30 NOTE — ED Triage Notes (Signed)
 Patient reports left ear pain x 2 days. Patient states I have a bad tooth on left lower side and does not know if it's effecting my ear.  Rates pain 7/10. Patient has not used anything for symptoms.

## 2023-09-30 NOTE — ED Provider Notes (Signed)
 Derrick Quinn    CSN: [id number] Arrival date & time: 09/30/23  1158      History   Chief Complaint Chief Complaint  Patient presents with   Otalgia    HPI Derrick Quinn is a 53 y.o. male.   Patient presents for evaluation of left-sided ear pain present for 2 days.  It radiates down into the left jaw, concerned that it may be related to his teeth which she knows he needs dental work for.  Endorses mild dental pain, tolerable to food and liquids.  Has been began to experience chest tightness, unsure if related.  Denies shortness of breath, cough, fever.  Denies respiratory history, non-smoker.  Recent tick bite, unsure if related, course of doxycycline  for treatment.  Past Medical History:  Diagnosis Date   Anxiety    Chicken pox    Depression     Patient Active Problem List   Diagnosis Date Noted   Chronic left shoulder pain 12/31/2022   Chest wall pain 10/21/2022   Nonspecific syndrome suggestive of viral illness 10/08/2022   Hypotension 09/04/2022   Screening for STD (sexually transmitted disease) 06/30/2022   Pain in left testicle 06/30/2022   Palpitations 01/26/2022   GERD (gastroesophageal reflux disease) 01/26/2022   Malnutrition of moderate degree (HCC) 01/26/2022   Lumbar pain 07/09/2021   History of Helicobacter pylori infection 07/09/2021   Anxiety and depression 07/09/2021   Left knee pain 06/03/2021   Right shoulder pain 06/03/2021    Past Surgical History:  Procedure Laterality Date   APPENDECTOMY     COLONOSCOPY  09/2018   ESOPHAGOGASTRODUODENOSCOPY     2020       Home Medications    Prior to Admission medications   Medication Sig Start Date End Date Taking? Authorizing Provider  amoxicillin -clavulanate (AUGMENTIN ) 875-125 MG tablet Take 1 tablet by mouth every 12 (twelve) hours. 09/30/23  Yes Lindsee Labarre R, NP  predniSONE  (STERAPRED UNI-PAK 21 TAB) 10 MG (21) TBPK tablet Take by mouth daily. Take 6 tabs by mouth daily  for 1  days, then 5 tabs for 1 days, then 4 tabs for 1 days, then 3 tabs for 1 days, 2 tabs for 1 days, then 1 tab by mouth daily for 1 days 09/30/23  Yes Samatha Anspach R, NP  ALPRAZolam  (XANAX ) 0.25 MG tablet Take 1 tablet (0.25 mg total) by mouth 2 (two) times daily as needed for anxiety. Patient not taking: Reported on 12/31/2022 05/29/22   Jimmy Charlie FERNS, MD  azithromycin  (ZITHROMAX ) 250 MG tablet Take 1 tablet (250 mg total) by mouth daily. Take first 2 tablets together, then 1 every day until finished. 04/05/23   Teresa Shelba SAUNDERS, NP  chlorhexidine (PERIDEX) 0.12 % solution SMARTSIG:By Mouth 12/02/22   [provider]  clarithromycin  (BIAXIN ) 500 MG tablet Take 1 tablet (500 mg total) by mouth 2 (two) times daily. Patient not taking: Reported on 04/05/2023 09/08/22   Avelina Greig BRAVO, MD  cyclobenzaprine  (FLEXERIL ) 10 MG tablet Take 1 tablet (10 mg total) by mouth at bedtime. 10/29/21   Lauryl Seyer, Shelba SAUNDERS, NP  DULoxetine  (CYMBALTA ) 30 MG capsule Take 1 capsule (30 mg total) by mouth daily. 01/26/22   Letvak, Richard I, MD  hydrOXYzine  (ATARAX ) 50 MG tablet Take 1 tablet (50 mg total) by mouth every 6 (six) hours as needed for anxiety. May take 2 tabs at night 08/06/21   Mortenson, Ashley, MD  meloxicam  (MOBIC ) 7.5 MG tablet Take 1 tablet (7.5 mg total) by mouth  daily. 10/29/21   Teresa Shelba SAUNDERS, NP  ondansetron  (ZOFRAN -ODT) 4 MG disintegrating tablet Take 1 tablet (4 mg total) by mouth every 8 (eight) hours as needed. 04/05/23   Laneshia Pina, Shelba SAUNDERS, NP  pantoprazole  (PROTONIX ) 40 MG tablet Take 1 tablet (40 mg total) by mouth daily. Patient not taking: Reported on 04/05/2023 09/08/22   Avelina Greig BRAVO, MD    Family History Family History  Problem Relation Age of Onset   Cancer Mother    Mental illness Sister    Diabetes Paternal Aunt    Diabetes Paternal Grandmother    Hypertension Paternal Grandfather     Social History Social History   Tobacco Use   Smoking status: Never   Smokeless  tobacco: Never  Vaping Use   Vaping status: Never Used  Substance Use Topics   Alcohol use: Not Currently    Comment: two times per year   Drug use: Never     Allergies   Patient has no known allergies.   Review of Systems Review of Systems   Physical Exam Triage Vital Signs ED Triage Vitals  Encounter Vitals Group     BP 09/30/23 1211 132/81     Girls Systolic BP Percentile --      Girls Diastolic BP Percentile --      Boys Systolic BP Percentile --      Boys Diastolic BP Percentile --      Pulse Rate 09/30/23 1211 84     Resp 09/30/23 1211 18     Temp 09/30/23 1211 98.3 F (36.8 C)     Temp Source 09/30/23 1211 Oral     SpO2 09/30/23 1211 97 %     Weight --      Height --      Head Circumference --      Peak Flow --      Pain Score 09/30/23 1216 7     Pain Loc --      Pain Education --      Exclude from Growth Chart --    No data found.  Updated Vital Signs BP 132/81 (BP Location: Left Arm)   Pulse 84   Temp 98.3 F (36.8 C) (Oral)   Resp 18   SpO2 97%   Visual Acuity Right Eye Distance:   Left Eye Distance:   Bilateral Distance:    Right Eye Near:   Left Eye Near:    Bilateral Near:     Physical Exam Constitutional:      Appearance: Normal appearance.  HENT:     Right Ear: Tympanic membrane, ear canal and external ear normal.     Left Ear: Tympanic membrane, ear canal and external ear normal.     Mouth/Throat:     Comments: Small amount of dental decay noted to the left lower gumline Eyes:     Extraocular Movements: Extraocular movements intact.  Cardiovascular:     Rate and Rhythm: Normal rate and regular rhythm.     Pulses: Normal pulses.     Heart sounds: Normal heart sounds.  Pulmonary:     Effort: Pulmonary effort is normal.     Breath sounds: Normal breath sounds.  Neurological:     Mental Status: He is alert and oriented to person, place, and time. Mental status is at baseline.      UC Treatments / Results  Labs (all labs  ordered are listed, but only abnormal results are displayed) Labs Reviewed - No data to display  EKG   Radiology No results found.  Procedures Procedures (including critical care time)  Medications Ordered in UC Medications - No data to display  Initial Impression / Assessment and Plan / UC Course  I have reviewed the triage vital signs and the nursing notes.  Pertinent labs & imaging results that were available during my care of the patient were reviewed by me and considered in my medical decision making (see chart for details).  Dental Pain, chest tightness  Decay present on exam, no abnormalities to the left ear, some lymph node swelling noted to the left side, most likely related to the teeth, discussed with patient, prescribed Augmentin  and advised to monitor recommended supportive care and advised follow-up with dentist  Lungs are clear to auscultation, O2 saturation 97% on room air, no prior respiratory history, stable most likely not related to dental pain and ear pain, discussed, low suspicion related to tick bite, prescribed prednisone  and discussed administration advised to follow-up for any persisting or worsening symptoms Final Clinical Impressions(s) / UC Diagnoses   Final diagnoses:  Pain, dental  Chest tightness     Discharge Instructions      Your evaluated for your ear pain, on exam there is no abnormality to the ear but your lymph nodes are swollen and you know you are in need of dental pain, pain is most likely related to the teeth  Starting on antibiotics to treat any dental infection which should ideally help to resolve ear discomfort  Take Augmentin  twice daily for 7 days  You were evaluated today for chest tightness  On exam your lungs are clear and you are getting enough air without assistance  Begin prednisone  every morning with food as directed to relax the airway making it easier to breathe, this will also help with pain, avoid use of  ibuprofen  but may take Tylenol  Avoid ear cleaning until symptoms resolve   Now warm compresses to the outer ear and to the neck  If your chest tightness continues please follow-up for reevaluation as this would not be related to a dental or ear infection   ED Prescriptions     Medication Sig Dispense Auth. Provider   predniSONE  (STERAPRED UNI-PAK 21 TAB) 10 MG (21) TBPK tablet Take by mouth daily. Take 6 tabs by mouth daily  for 1 days, then 5 tabs for 1 days, then 4 tabs for 1 days, then 3 tabs for 1 days, 2 tabs for 1 days, then 1 tab by mouth daily for 1 days 21 tablet Dinita Migliaccio R, NP   amoxicillin -clavulanate (AUGMENTIN ) 875-125 MG tablet Take 1 tablet by mouth every 12 (twelve) hours. 14 tablet Datra Clary R, NP      PDMP not reviewed this encounter.   Teresa Shelba SAUNDERS, NP 09/30/23 1248

## 2023-12-01 ENCOUNTER — Ambulatory Visit
Admission: EM | Admit: 2023-12-01 | Discharge: 2023-12-01 | Disposition: A | Discharge status: Home / Self Care | Attending: Emergency Medicine | Admitting: Emergency Medicine

## 2023-12-01 ENCOUNTER — Encounter: Payer: Self-pay | Admitting: Emergency Medicine

## 2023-12-01 DIAGNOSIS — R519 Headache, unspecified: Secondary | ICD-10-CM

## 2023-12-01 DIAGNOSIS — H9202 Otalgia, left ear: Secondary | ICD-10-CM

## 2023-12-01 DIAGNOSIS — K0889 Other specified disorders of teeth and supporting structures: Secondary | ICD-10-CM | POA: Diagnosis not present

## 2023-12-01 MED ORDER — AMOXICILLIN 875 MG PO TABS: 875.0000 mg | ORAL_TABLET | Freq: Two times a day (BID) | ORAL | 0 refills | Status: AC

## 2023-12-01 NOTE — Discharge Instructions (Addendum)
Take the antibiotic as prescribed.    A dental resource guide is attached.  Please call to make an appointment with a dentist as soon as possible.    Go to the emergency department if you have worsening symptoms.

## 2023-12-01 NOTE — ED Triage Notes (Signed)
 Patient reports left ear pain,headache and toothache on left lower side that started yesterday. Rates pain 7/10. Patient used Anbesol Gel on tooth yesterday with mild relief.

## 2023-12-01 NOTE — ED Provider Notes (Signed)
 Derrick Quinn    CSN: [id number] Arrival date & time: 12/01/23  1743      History   Chief Complaint Chief Complaint  Patient presents with   Otalgia   Headache   Dental Pain    HPI Derrick Quinn is a 53 y.o. male.  Patient presents with left lower toothache x 1 day.  The pain is radiating to his left ear and causing a headache.  No OTC medications taken.  No fever, difficulty swallowing, shortness of breath.  Patient states he has an appointment with a dentist next week.  Patient was seen at this urgent care for dental pain and chest tightness on 09/30/2023; treated with Augmentin  and prednisone .  The history is provided by the patient and medical records.    Past Medical History:  Diagnosis Date   Anxiety    Chicken pox    Depression     Patient Active Problem List   Diagnosis Date Noted   Chronic left shoulder pain 12/31/2022   Chest wall pain 10/21/2022   Nonspecific syndrome suggestive of viral illness 10/08/2022   Hypotension 09/04/2022   Screening for STD (sexually transmitted disease) 06/30/2022   Pain in left testicle 06/30/2022   Palpitations 01/26/2022   GERD (gastroesophageal reflux disease) 01/26/2022   Malnutrition of moderate degree 01/26/2022   Lumbar pain 07/09/2021   History of Helicobacter pylori infection 07/09/2021   Anxiety and depression 07/09/2021   Left knee pain 06/03/2021   Right shoulder pain 06/03/2021    Past Surgical History:  Procedure Laterality Date   APPENDECTOMY     COLONOSCOPY  09/2018   ESOPHAGOGASTRODUODENOSCOPY     2020       Home Medications    Prior to Admission medications   Medication Sig Start Date End Date Taking? Authorizing Provider  amoxicillin  (AMOXIL ) 875 MG tablet Take 1 tablet (875 mg total) by mouth 2 (two) times daily for 7 days. 12/01/23 12/08/23 Yes Corlis Burnard DEL, NP  ALPRAZolam  (XANAX ) 0.25 MG tablet Take 1 tablet (0.25 mg total) by mouth 2 (two) times daily as needed for anxiety. Patient  not taking: Reported on 12/31/2022 05/29/22   Jimmy Charlie FERNS, MD  chlorhexidine (PERIDEX) 0.12 % solution SMARTSIG:By Mouth 12/02/22   [provider]  clarithromycin  (BIAXIN ) 500 MG tablet Take 1 tablet (500 mg total) by mouth 2 (two) times daily. Patient not taking: Reported on 04/05/2023 09/08/22   Avelina Greig BRAVO, MD  cyclobenzaprine  (FLEXERIL ) 10 MG tablet Take 1 tablet (10 mg total) by mouth at bedtime. 10/29/21   White, Shelba SAUNDERS, NP  DULoxetine  (CYMBALTA ) 30 MG capsule Take 1 capsule (30 mg total) by mouth daily. 01/26/22   Letvak, Richard I, MD  hydrOXYzine  (ATARAX ) 50 MG tablet Take 1 tablet (50 mg total) by mouth every 6 (six) hours as needed for anxiety. May take 2 tabs at night 08/06/21   Mortenson, Ashley, MD  meloxicam  (MOBIC ) 7.5 MG tablet Take 1 tablet (7.5 mg total) by mouth daily. 10/29/21   White, Shelba SAUNDERS, NP  ondansetron  (ZOFRAN -ODT) 4 MG disintegrating tablet Take 1 tablet (4 mg total) by mouth every 8 (eight) hours as needed. 04/05/23   Teresa Shelba SAUNDERS, NP  pantoprazole  (PROTONIX ) 40 MG tablet Take 1 tablet (40 mg total) by mouth daily. Patient not taking: Reported on 04/05/2023 09/08/22   Avelina Greig BRAVO, MD    Family History Family History  Problem Relation Age of Onset   Cancer Mother    Mental illness Sister  Diabetes Paternal Aunt    Diabetes Paternal Grandmother    Hypertension Paternal Grandfather     Social History Social History   Tobacco Use   Smoking status: Never   Smokeless tobacco: Never  Vaping Use   Vaping status: Never Used  Substance Use Topics   Alcohol use: Not Currently    Comment: two times per year   Drug use: Never     Allergies   Patient has no known allergies.   Review of Systems Review of Systems  Constitutional:  Negative for chills and fever.  HENT:  Positive for dental problem and ear pain. Negative for sore throat, trouble swallowing and voice change.   Respiratory:  Negative for cough and shortness of breath.    Neurological:  Positive for headaches.     Physical Exam Triage Vital Signs ED Triage Vitals  Encounter Vitals Group     BP 12/01/23 1902 139/89     Girls Systolic BP Percentile --      Girls Diastolic BP Percentile --      Boys Systolic BP Percentile --      Boys Diastolic BP Percentile --      Pulse Rate 12/01/23 1902 74     Resp 12/01/23 1902 18     Temp 12/01/23 1902 98 F (36.7 C)     Temp src --      SpO2 12/01/23 1902 99 %     Weight --      Height --      Head Circumference --      Peak Flow --      Pain Score 12/01/23 1908 7     Pain Loc --      Pain Education --      Exclude from Growth Chart --    No data found.  Updated Vital Signs BP 139/89   Pulse 74   Temp 98 F (36.7 C)   Resp 18   SpO2 99%   Visual Acuity Right Eye Distance:   Left Eye Distance:   Bilateral Distance:    Right Eye Near:   Left Eye Near:    Bilateral Near:     Physical Exam Constitutional:      General: He is not in acute distress. HENT:     Right Ear: Tympanic membrane normal.     Left Ear: Tympanic membrane normal.     Nose: Nose normal.     Mouth/Throat:     Mouth: Mucous membranes are moist.     Pharynx: Oropharynx is clear.   Cardiovascular:     Rate and Rhythm: Normal rate and regular rhythm.     Heart sounds: Normal heart sounds.  Pulmonary:     Effort: Pulmonary effort is normal. No respiratory distress.     Breath sounds: Normal breath sounds.  Neurological:     Mental Status: He is alert.      UC Treatments / Results  Labs (all labs ordered are listed, but only abnormal results are displayed) Labs Reviewed - No data to display  EKG   Radiology No results found.  Procedures Procedures (including critical care time)  Medications Ordered in UC Medications - No data to display  Initial Impression / Assessment and Plan / UC Course  I have reviewed the triage vital signs and the nursing notes.  Pertinent labs & imaging results that were  available during my care of the patient were reviewed by me and considered in my medical decision making (  see chart for details).    Dental pain, left ear pain, headache.  Treating dental pain with amoxicillin .  Tylenol or ibuprofen  as needed.  Dental resource guide provided.  Education provided on dental pain.  Instructed patient to follow-up with his dentist to soon as possible.  ED precautions given.  He agrees to plan of care.  Final Clinical Impressions(s) / UC Diagnoses   Final diagnoses:  Pain, dental  Acute otalgia, left  Acute nonintractable headache, unspecified headache type     Discharge Instructions      Take the antibiotic as prescribed.    A dental resource guide is attached.  Please call to make an appointment with a dentist as soon as possible.    Go to the emergency department if you have worsening symptoms.         ED Prescriptions     Medication Sig Dispense Auth. Provider   amoxicillin  (AMOXIL ) 875 MG tablet Take 1 tablet (875 mg total) by mouth 2 (two) times daily for 7 days. 14 tablet Corlis Burnard DEL, NP      PDMP not reviewed this encounter.   Corlis Burnard DEL, NP 12/01/23 [phone]

## 2023-12-03 ENCOUNTER — Ambulatory Visit: Admitting: Nurse Practitioner

## 2024-01-10 ENCOUNTER — Encounter: Payer: Self-pay | Admitting: Radiology

## 2024-01-18 ENCOUNTER — Telehealth: Payer: Self-pay

## 2024-01-18 NOTE — Telephone Encounter (Signed)
 Sending note to [facility] admin.

## 2024-01-18 NOTE — Telephone Encounter (Signed)
 SABRA

## 2024-01-28 ENCOUNTER — Encounter: Admitting: Nurse Practitioner

## 2024-01-28 NOTE — Progress Notes (Deleted)
   Established Patient Office Visit  Subjective   Patient ID: Lochlin Eppinger, male    DOB: September 08, 1970  Age: 53 y.o. MRN: [id number]  No chief complaint on file.   HPI  Anxiety:  for complete physical and follow up of chronic conditions.  Immunizations: -Tetanus: Completed in? -Influenza:  -Shingles:? -Pneumonia:?  Diet: Fair diet.  Exercise: No regular exercise.  Eye exam: Completes annually  Dental exam: Completes semi-annually    Colonoscopy:? Lung Cancer Screening: N/A    PSA: Due  Sleep:     {History (Optional):23778}  ROS    Objective:     There were no vitals taken for this visit. {Vitals History (Optional):23777}  Physical Exam   No results found for any visits on 01/28/24.  {Labs (Optional):23779}  The ASCVD Risk score (Arnett DK, et al., 2019) failed to calculate for the following reasons:   Cannot find a previous HDL lab   Cannot find a previous total cholesterol lab    Assessment & Plan:   Problem List Items Addressed This Visit   None   No follow-ups on file.    Adina Crandall, NP

## 2024-02-13 NOTE — Progress Notes (Deleted)
     Santhosh Gulino T. Ayomide Zuleta, MD, CAQ Sports Medicine [facility] at [facility] [address] [place], [address]  Phone: [phone]  FAX: [phone]  Derrick Quinn - 53 y.o. male  MRN [id number]  Date of Birth: 11/09/1970  Date: 02/14/2024  PCP: Wendee Lynwood HERO, NP  Referral: Wendee Lynwood HERO, NP  No chief complaint on file.  Subjective:   Derrick Quinn is a 53 y.o. very pleasant male patient with There is no height or weight on file to calculate BMI. who presents with the following:  Discussed the use of AI scribe software for clinical note transcription with the patient, who gave verbal consent to proceed.  Derrick Quinn presents with some ongoing issues with swollen lymph nodes. History of Present Illness     Review of Systems is noted in the HPI, as appropriate  Objective:   There were no vitals taken for this visit.  GEN: No acute distress; alert,appropriate. PULM: Breathing comfortably in no respiratory distress PSYCH: Normally interactive.   Laboratory and Imaging Data:  Assessment and Plan:   No diagnosis found. Assessment & Plan   Medication Management during today's office visit: No orders of the defined types were placed in this encounter.  There are no discontinued medications.  Orders placed today for conditions managed today: No orders of the defined types were placed in this encounter.   Disposition: No follow-ups on file.  Dragon [employer] One speech-to-text software was used for transcription in this dictation.  Possible transcriptional errors can occur using Animal nutritionist.   Signed,  Jacques DASEN. Tenna Lacko, MD   Outpatient Encounter Medications as of 02/14/2024  Medication Sig   ALPRAZolam  (XANAX ) 0.25 MG tablet Take 1 tablet (0.25 mg total) by mouth 2 (two) times daily as needed for anxiety. (Patient not taking: Reported on 12/31/2022)   chlorhexidine (PERIDEX) 0.12 % solution SMARTSIG:By Mouth   clarithromycin  (BIAXIN )  500 MG tablet Take 1 tablet (500 mg total) by mouth 2 (two) times daily. (Patient not taking: Reported on 04/05/2023)   cyclobenzaprine  (FLEXERIL ) 10 MG tablet Take 1 tablet (10 mg total) by mouth at bedtime.   DULoxetine  (CYMBALTA ) 30 MG capsule Take 1 capsule (30 mg total) by mouth daily.   hydrOXYzine  (ATARAX ) 50 MG tablet Take 1 tablet (50 mg total) by mouth every 6 (six) hours as needed for anxiety. May take 2 tabs at night   meloxicam  (MOBIC ) 7.5 MG tablet Take 1 tablet (7.5 mg total) by mouth daily.   ondansetron  (ZOFRAN -ODT) 4 MG disintegrating tablet Take 1 tablet (4 mg total) by mouth every 8 (eight) hours as needed.   pantoprazole  (PROTONIX ) 40 MG tablet Take 1 tablet (40 mg total) by mouth daily. (Patient not taking: Reported on 04/05/2023)   No facility-administered encounter medications on file as of 02/14/2024.

## 2024-02-14 ENCOUNTER — Ambulatory Visit: Admitting: Family Medicine

## 2024-03-15 ENCOUNTER — Encounter: Payer: Self-pay | Admitting: Emergency Medicine

## 2024-03-15 ENCOUNTER — Ambulatory Visit
Admission: EM | Admit: 2024-03-15 | Discharge: 2024-03-15 | Disposition: A | Discharge status: Home / Self Care | Payer: Self-pay | Source: Home / Self Care

## 2024-03-15 DIAGNOSIS — R350 Frequency of micturition: Secondary | ICD-10-CM | POA: Insufficient documentation

## 2024-03-15 DIAGNOSIS — Z113 Encounter for screening for infections with a predominantly sexual mode of transmission: Secondary | ICD-10-CM | POA: Insufficient documentation

## 2024-03-15 LAB — POCT URINE DIPSTICK
Glucose, UA: NEGATIVE mg/dL
Ketones, POC UA: NEGATIVE mg/dL
Leukocytes, UA: NEGATIVE
Nitrite, UA: NEGATIVE
POC PROTEIN,UA: 100 — AB
Spec Grav, UA: >=1.03 — AB
Urobilinogen, UA: 1 U/dL
pH, UA: 6.5

## 2024-03-15 NOTE — Progress Notes (Unsigned)
" ° ° ° °  Derrick Quinn T. Klein Willcox, MD, CAQ Sports Medicine [facility] at [facility] [address] [place], [address]  Phone: [phone]  FAX: [phone]  Derrick Quinn - 54 y.o. male  MRN [id number]  Date of Birth: [birth year]  Date: 03/16/2024  PCP: Wendee Lynwood HERO, NP  Referral: Wendee Lynwood HERO, NP  No chief complaint on file.  Subjective:   Derrick Quinn is a 54 y.o. very pleasant male patient with There is no height or weight on file to calculate BMI. who presents with the following:  Discussed the use of AI scribe software for clinical note transcription with the patient, who gave verbal consent to proceed.  Patient presents with some concerns regarding swollen lymph nodes.   History of Present Illness     Review of Systems is noted in the HPI, as appropriate  Objective:   There were no vitals taken for this visit.  GEN: No acute distress; alert,appropriate. PULM: Breathing comfortably in no respiratory distress PSYCH: Normally interactive.   Laboratory and Imaging Data:  Assessment and Plan:   No diagnosis found. Assessment & Plan   Medication Management during today's office visit: No orders of the defined types were placed in this encounter.  There are no discontinued medications.  Orders placed today for conditions managed today: No orders of the defined types were placed in this encounter.   Disposition: No follow-ups on file.  Dragon [employer] One speech-to-text software was used for transcription in this dictation.  Possible transcriptional errors can occur using Animal nutritionist.   Signed,  Jacques DASEN. Marnisha Stampley, MD   Outpatient Encounter Medications as of 03/16/2024  Medication Sig   ALPRAZolam  (XANAX ) 0.25 MG tablet Take 1 tablet (0.25 mg total) by mouth 2 (two) times daily as needed for anxiety. (Patient not taking: Reported on 12/31/2022)   chlorhexidine (PERIDEX) 0.12 % solution SMARTSIG:By Mouth   clarithromycin   (BIAXIN ) 500 MG tablet Take 1 tablet (500 mg total) by mouth 2 (two) times daily. (Patient not taking: Reported on 04/05/2023)   cyclobenzaprine  (FLEXERIL ) 10 MG tablet Take 1 tablet (10 mg total) by mouth at bedtime.   DULoxetine  (CYMBALTA ) 30 MG capsule Take 1 capsule (30 mg total) by mouth daily.   hydrOXYzine  (ATARAX ) 50 MG tablet Take 1 tablet (50 mg total) by mouth every 6 (six) hours as needed for anxiety. May take 2 tabs at night   meloxicam  (MOBIC ) 7.5 MG tablet Take 1 tablet (7.5 mg total) by mouth daily.   ondansetron  (ZOFRAN -ODT) 4 MG disintegrating tablet Take 1 tablet (4 mg total) by mouth every 8 (eight) hours as needed.   pantoprazole  (PROTONIX ) 40 MG tablet Take 1 tablet (40 mg total) by mouth daily. (Patient not taking: Reported on 04/05/2023)   No facility-administered encounter medications on file as of 03/16/2024.   "

## 2024-03-15 NOTE — Discharge Instructions (Signed)
 It is unclear what is causing your urinary symptoms.  STD testing will come back in the next 2 to 3 days, staff will call if your STD testing is abnormal.  Please drink plenty of water to stay well-hydrated.  I suspect that this could be causing some of your urinary symptoms.  Please schedule a follow-up appointment with your primary care provider if your urinary symptoms persist.  If you develop any new or worsening symptoms or if your symptoms do not start to improve, please return here or follow-up with your primary care provider. If your symptoms are severe, please go to the emergency room.

## 2024-03-15 NOTE — ED Triage Notes (Signed)
 Patient reports frequency urination today. Patient denies painful urination.

## 2024-03-15 NOTE — ED Provider Notes (Signed)
 " [facility]    CSN: [id number] Arrival date & time: 03/15/24  1819      History   Chief Complaint Chief Complaint  Patient presents with   Urinary Frequency    HPI Derrick Quinn is a 54 y.o. male.   Derrick Quinn is a 54 y.o. male presenting for chief complaint of Urinary Frequency that started this afternoon.  Patient felt the need to void twice within 1 hour and became concerned that he may have a urinary tract infection. He reports associated urinary urgency with urinary frequency.  Denies dysuria, gross hematuria, weakened urinary stream, urinary hesitancy, penile discharge, penile rash, abdominal/pelvic pain, low back pain, fever/chills, nausea, vomiting, headaches, dizziness, or concern for STD. Sexually active in monogamous relationship with male partner, no known exposures to STDs. He denies scrotal pain. He tries to drink 2-3 bottles of water on a typical day, he only had 1 bottle of water to drink today.  He denies frequent intake of urinary irritants stating he mostly drinks water.  He does not drink caffeinated beverages such as energy drinks, sweet tea, or coffee. He has not attempted use of any over-the-counter medications to help with his symptoms prior to arrival. Denies history of UTI. The last time he experienced the symptoms was in his 64s and he had gonorrhea.   Urinary Frequency    Past Medical History:  Diagnosis Date   Anxiety    Chicken pox    Depression     Patient Active Problem List   Diagnosis Date Noted   Chronic left shoulder pain 12/31/2022   Chest wall pain 10/21/2022   Nonspecific syndrome suggestive of viral illness 10/08/2022   Hypotension 09/04/2022   Screening for STD (sexually transmitted disease) 06/30/2022   Pain in left testicle 06/30/2022   Palpitations 01/26/2022   GERD (gastroesophageal reflux disease) 01/26/2022   Malnutrition of moderate degree 01/26/2022   Lumbar pain 07/09/2021   History of Helicobacter  pylori infection 07/09/2021   Anxiety and depression 07/09/2021   Left knee pain 06/03/2021   Right shoulder pain 06/03/2021    Past Surgical History:  Procedure Laterality Date   APPENDECTOMY     COLONOSCOPY  09/2018   ESOPHAGOGASTRODUODENOSCOPY     2020       Home Medications    Prior to Admission medications  Medication Sig Start Date End Date Taking? Authorizing Provider  ALPRAZolam  (XANAX ) 0.25 MG tablet Take 1 tablet (0.25 mg total) by mouth 2 (two) times daily as needed for anxiety. Patient not taking: Reported on 12/31/2022 05/29/22   Jimmy Charlie FERNS, MD  chlorhexidine (PERIDEX) 0.12 % solution SMARTSIG:By Mouth 12/02/22   [provider]  clarithromycin  (BIAXIN ) 500 MG tablet Take 1 tablet (500 mg total) by mouth 2 (two) times daily. Patient not taking: Reported on 04/05/2023 09/08/22   Avelina Greig BRAVO, MD  cyclobenzaprine  (FLEXERIL ) 10 MG tablet Take 1 tablet (10 mg total) by mouth at bedtime. 10/29/21   White, Shelba SAUNDERS, NP  DULoxetine  (CYMBALTA ) 30 MG capsule Take 1 capsule (30 mg total) by mouth daily. 01/26/22   Letvak, Richard I, MD  hydrOXYzine  (ATARAX ) 50 MG tablet Take 1 tablet (50 mg total) by mouth every 6 (six) hours as needed for anxiety. May take 2 tabs at night 08/06/21   Mortenson, Ashley, MD  meloxicam  (MOBIC ) 7.5 MG tablet Take 1 tablet (7.5 mg total) by mouth daily. 10/29/21   White, Shelba SAUNDERS, NP  ondansetron  (ZOFRAN -ODT) 4 MG disintegrating tablet Take  1 tablet (4 mg total) by mouth every 8 (eight) hours as needed. 04/05/23   White, Shelba SAUNDERS, NP  pantoprazole  (PROTONIX ) 40 MG tablet Take 1 tablet (40 mg total) by mouth daily. Patient not taking: Reported on 04/05/2023 09/08/22   Avelina Greig BRAVO, MD    Family History Family History  Problem Relation Age of Onset   Cancer Mother    Mental illness Sister    Diabetes Paternal Aunt    Diabetes Paternal Grandmother    Hypertension Paternal Grandfather     Social History Social  History[1]   Allergies   Patient has no known allergies.   Review of Systems Review of Systems  Genitourinary:  Positive for frequency.  Per HPI   Physical Exam Triage Vital Signs ED Triage Vitals  Encounter Vitals Group     BP 03/15/24 1855 121/77     Girls Systolic BP Percentile --      Girls Diastolic BP Percentile --      Boys Systolic BP Percentile --      Boys Diastolic BP Percentile --      Pulse Rate 03/15/24 1855 75     Resp 03/15/24 1855 20     Temp 03/15/24 1855 98.7 F (37.1 C)     Temp Source 03/15/24 1855 Oral     SpO2 03/15/24 1855 98 %     Weight --      Height --      Head Circumference --      Peak Flow --      Pain Score 03/15/24 1857 0     Pain Loc --      Pain Education --      Exclude from Growth Chart --    No data found.  Updated Vital Signs BP 121/77 (BP Location: Right Arm)   Pulse 75   Temp 98.7 F (37.1 C) (Oral)   Resp 20   SpO2 98%   Visual Acuity Right Eye Distance:   Left Eye Distance:   Bilateral Distance:    Right Eye Near:   Left Eye Near:    Bilateral Near:     Physical Exam Vitals and nursing note reviewed.  Constitutional:      Appearance: He is not ill-appearing or toxic-appearing.  HENT:     Head: Normocephalic and atraumatic.     Right Ear: Hearing and external ear normal.     Left Ear: Hearing and external ear normal.     Nose: Nose normal.     Mouth/Throat:     Lips: Pink.  Eyes:     General: Lids are normal. Vision grossly intact. Gaze aligned appropriately.     Extraocular Movements: Extraocular movements intact.     Conjunctiva/sclera: Conjunctivae normal.  Pulmonary:     Effort: Pulmonary effort is normal.  Abdominal:     General: Bowel sounds are normal.     Palpations: Abdomen is soft.     Tenderness: There is no abdominal tenderness. There is no right CVA tenderness, left CVA tenderness or guarding.  Musculoskeletal:     Cervical back: Neck supple.  Skin:    General: Skin is warm and dry.      Capillary Refill: Capillary refill takes less than 2 seconds.     Findings: No rash.  Neurological:     General: No focal deficit present.     Mental Status: He is alert and oriented to person, place, and time. Mental status is at baseline.  Cranial Nerves: No dysarthria or facial asymmetry.  Psychiatric:        Mood and Affect: Mood normal.        Speech: Speech normal.        Behavior: Behavior normal.        Thought Content: Thought content normal.        Judgment: Judgment normal.      UC Treatments / Results  Labs (all labs ordered are listed, but only abnormal results are displayed) Labs Reviewed  URINE CULTURE - Abnormal; Notable for the following components:      Result Value   Culture   (*)    Value: <10,000 COLONIES/mL INSIGNIFICANT GROWTH Performed at [facility], [address], [place], [place] [address]    All other components within normal limits  POCT URINE DIPSTICK - Abnormal; Notable for the following components:   Bilirubin, UA small (*)    Spec Grav, UA >=1.030 (*)    Blood, UA small (*)    POC PROTEIN,UA =100 (*)    All other components within normal limits  CYTOLOGY, (ORAL, ANAL, URETHRAL) ANCILLARY ONLY    EKG   Radiology No results found.  Procedures Procedures (including critical care time)  Medications Ordered in UC Medications - No data to display  Initial Impression / Assessment and Plan / UC Course  I have reviewed the triage vital signs and the nursing notes.  Pertinent labs & imaging results that were available during my care of the patient were reviewed by me and considered in my medical decision making (see chart for details).   1.  Urinary frequency, screening for STD It's unclear what is causing patient's urinary symptoms. Urinalysis shows small blood with small proteinuria and elevated urine specific gravity. Urine culture is pending to evaluate for bacterial growth. Low suspicion for nephrolithiasis,  pyelonephritis, prostatitis etc. Cytology swab is pending for further evaluation, staff will call if abnormal or if swab results require change in treatment plan and treat per protocol. I have advised the patient to increase his water intake to at least 64 ounces of water per day to stay well-hydrated.  He may follow-up with his primary care provider should his symptoms fail to improve or persist in the next 5 to 7 days.  Counseled patient on potential for adverse effects with medications prescribed/recommended today, strict ER and return-to-clinic precautions discussed, patient verbalized understanding.    Final Clinical Impressions(s) / UC Diagnoses   Final diagnoses:  Urinary frequency  Screening for STD (sexually transmitted disease)     Discharge Instructions      It is unclear what is causing your urinary symptoms.  STD testing will come back in the next 2 to 3 days, staff will call if your STD testing is abnormal.  Please drink plenty of water to stay well-hydrated.  I suspect that this could be causing some of your urinary symptoms.  Please schedule a follow-up appointment with your primary care provider if your urinary symptoms persist.  If you develop any new or worsening symptoms or if your symptoms do not start to improve, please return here or follow-up with your primary care provider. If your symptoms are severe, please go to the emergency room.     ED Prescriptions   None    PDMP not reviewed this encounter.     [1]  Social History Tobacco Use   Smoking status: Never   Smokeless tobacco: Never  Vaping Use   Vaping status: Never  Used  Substance Use Topics   Alcohol use: Not Currently    Comment: two times per year   Drug use: Never     Enedelia Dorna HERO, FNP 03/16/24 2226  "

## 2024-03-16 ENCOUNTER — Ambulatory Visit: Admitting: Family Medicine

## 2024-03-16 ENCOUNTER — Ambulatory Visit (HOSPITAL_COMMUNITY): Payer: Self-pay

## 2024-03-16 LAB — CYTOLOGY, (ORAL, ANAL, URETHRAL) ANCILLARY ONLY
Chlamydia: NEGATIVE
Comment: NEGATIVE
Comment: NEGATIVE
Comment: NORMAL
Neisseria Gonorrhea: NEGATIVE
Trichomonas: NEGATIVE

## 2024-03-16 LAB — URINE CULTURE: Culture: <10000 — AB

## 2024-03-21 ENCOUNTER — Telehealth: Payer: Self-pay

## 2024-03-21 NOTE — Telephone Encounter (Signed)
 Sending to [facility] admin

## 2024-03-21 NOTE — Telephone Encounter (Signed)
 SABRA

## 2024-04-04 ENCOUNTER — Encounter: Payer: Self-pay | Admitting: Family Medicine

## 2024-04-04 ENCOUNTER — Telehealth: Payer: Self-pay | Admitting: Family Medicine

## 2024-04-04 NOTE — Telephone Encounter (Signed)
 Patient on my schedule tomorrow at 12 for routine follow-up should be scheduled for follow-up or physical with PCP unless there is a specific acute concern.

## 2024-04-05 ENCOUNTER — Encounter: Payer: Self-pay | Admitting: Family Medicine

## 2024-04-05 ENCOUNTER — Ambulatory Visit: Payer: Self-pay | Admitting: Family Medicine

## 2024-04-05 VITALS — BP 100/70 | HR 67 | Temp 98.9°F | Ht 68.74 in | Wt 124.0 lb

## 2024-04-05 DIAGNOSIS — Z13220 Encounter for screening for lipoid disorders: Secondary | ICD-10-CM

## 2024-04-05 DIAGNOSIS — Z131 Encounter for screening for diabetes mellitus: Secondary | ICD-10-CM

## 2024-04-05 DIAGNOSIS — K047 Periapical abscess without sinus: Secondary | ICD-10-CM

## 2024-04-05 DIAGNOSIS — R5383 Other fatigue: Secondary | ICD-10-CM

## 2024-04-05 DIAGNOSIS — Z125 Encounter for screening for malignant neoplasm of prostate: Secondary | ICD-10-CM

## 2024-04-05 MED ORDER — AMOXICILLIN 875 MG PO TABS: 875.0000 mg | ORAL_TABLET | Freq: Two times a day (BID) | ORAL | 0 refills | Status: AC

## 2024-04-05 NOTE — Telephone Encounter (Signed)
 Derrick Quinn has called and left patient a message to call the office back in regards to his appointment today with Dr. Watt.

## 2024-04-05 NOTE — Progress Notes (Signed)
 "    Derrick Quinn T. [name], MD, CAQ Sports Medicine [facility] at [facility] [address] [place], [address]  Phone: [phone]  FAX: [phone]  [name] - 54 y.o. male  MRN [id number]  Date of Birth: 1970/06/10  Date: 04/05/2024  PCP: Wendee Lynwood HERO, NP  Referral: Wendee Lynwood HERO, NP  Chief Complaint  Patient presents with   Adenopathy    Neck   Subjective:   Derrick Quinn is a 54 y.o. very pleasant male patient with Body mass index is 18.45 kg/m. who presents with the following:  Discussed the use of AI scribe software for clinical note transcription with the patient, who gave verbal consent to proceed.  Patient presents with possible swollen lymph nodes. History of Present Illness Derrick Quinn is a 54 year old male who presents with swollen glands and pain due to cracked teeth, impacted wisdom tooth on the left..  He has been experiencing swollen glands and pain in the neck area, which he attributes to cracked teeth and the pain is predominantly adjacent to the cracked tooth on the back left. The pain and swelling are located on both sides of his neck, near the lymph nodes, and have been present since the beginning of the month. He has an appointment scheduled for tooth extraction.  No swelling in the mouth. He has no known allergies. He has not had any blood work done since moving to the area.    Review of Systems is noted in the HPI, as appropriate  Objective:   BP 100/70   Pulse 67   Temp 98.9 F (37.2 C) (Temporal)   Ht 5' 8.74 (1.746 m)   Wt 124 lb (56.2 kg)   SpO2 97%   BMI 18.45 kg/m   GEN: No acute distress; alert,appropriate. PULM: Breathing comfortably in no respiratory distress PSYCH: Normally interactive.   Left sided impacted wisdom tooth  Tender to palpation at the jawline just above the jawline as well as at the corners of the jaw with notable lymphadenopathy in the anterior and posterior cervical  chains  Laboratory and Imaging Data:  Assessment and Plan:     ICD-10-CM   1. Dental infection  K04.7     2. Screening for diabetes mellitus  Z13.1 Basic metabolic panel with GFR    Hemoglobin A1c    3. Screening, lipid  Z13.220 Lipid panel    4. Screening for prostate cancer  Z12.5 PSA, Total with Reflex to PSA, Free    5. Other fatigue  R53.83 Basic metabolic panel with GFR    CBC with Differential/Platelet    Hepatic function panel     Assessment & Plan Dental infection Suspected dental infection likely due to cracked teeth with lymphadenopathy and pain.  Cannot exclude parotid infection - Prescribed amoxicillin , two tablets daily for seven days. - Advised to pick up prescription at [facility] on [address]. 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  General Health Maintenance No recent blood work or colonoscopy since 2020. Discussed importance of routine health maintenance. - Ordered blood work including cholesterol and blood sugar levels. - Advised to make a lab appointment for blood work, preferably fasting in the morning. Follow-up complete physical PCP  Medication Management during today's office visit: Meds ordered this encounter  Medications   amoxicillin  (AMOXIL ) 875 MG tablet    Sig: Take 1 tablet (875 mg total) by mouth 2 (two) times daily.    Dispense:  14 tablet  Refill:  0   Medications Discontinued During This Encounter  Medication Reason   hydrOXYzine  (ATARAX ) 50 MG tablet Completed Course   meloxicam  (MOBIC ) 7.5 MG tablet Completed Course   cyclobenzaprine  (FLEXERIL ) 10 MG tablet Completed Course   DULoxetine  (CYMBALTA ) 30 MG capsule Completed Course   ALPRAZolam  (XANAX ) 0.25 MG tablet Completed Course   clarithromycin  (BIAXIN ) 500 MG tablet Completed Course   pantoprazole  (PROTONIX ) 40 MG tablet Completed Course   chlorhexidine (PERIDEX) 0.12 % solution Completed Course   ondansetron  (ZOFRAN -ODT) 4 MG disintegrating tablet Completed  Course    Orders placed today for conditions managed today: Orders Placed This Encounter  Procedures   Basic metabolic panel with GFR   CBC with Differential/Platelet   Hepatic function panel   Hemoglobin A1c   Lipid panel   PSA, Total with Reflex to PSA, Free    Disposition: Return for lab appt Friday for blood work.  [employer] One speech-to-text software was used for transcription in this dictation.  Possible transcriptional errors can occur using Animal nutritionist.   Signed,  Jacques DASEN. Chaniqua Brisby, MD   Outpatient Encounter Medications as of 04/05/2024  Medication Sig   amoxicillin  (AMOXIL ) 875 MG tablet Take 1 tablet (875 mg total) by mouth 2 (two) times daily.   [DISCONTINUED] ALPRAZolam  (XANAX ) 0.25 MG tablet Take 1 tablet (0.25 mg total) by mouth 2 (two) times daily as needed for anxiety. (Patient not taking: Reported on 12/31/2022)   [DISCONTINUED] chlorhexidine (PERIDEX) 0.12 % solution SMARTSIG:By Mouth   [DISCONTINUED] clarithromycin  (BIAXIN ) 500 MG tablet Take 1 tablet (500 mg total) by mouth 2 (two) times daily. (Patient not taking: Reported on 04/05/2023)   [DISCONTINUED] cyclobenzaprine  (FLEXERIL ) 10 MG tablet Take 1 tablet (10 mg total) by mouth at bedtime.   [DISCONTINUED] DULoxetine  (CYMBALTA ) 30 MG capsule Take 1 capsule (30 mg total) by mouth daily.   [DISCONTINUED] hydrOXYzine  (ATARAX ) 50 MG tablet Take 1 tablet (50 mg total) by mouth every 6 (six) hours as needed for anxiety. May take 2 tabs at night   [DISCONTINUED] meloxicam  (MOBIC ) 7.5 MG tablet Take 1 tablet (7.5 mg total) by mouth daily.   [DISCONTINUED] ondansetron  (ZOFRAN -ODT) 4 MG disintegrating tablet Take 1 tablet (4 mg total) by mouth every 8 (eight) hours as needed.   [DISCONTINUED] pantoprazole  (PROTONIX ) 40 MG tablet Take 1 tablet (40 mg total) by mouth daily. (Patient not taking: Reported on 04/05/2023)   No facility-administered encounter medications on file as of 04/05/2024.   "

## 2024-04-07 ENCOUNTER — Ambulatory Visit: Payer: Self-pay

## 2024-04-07 NOTE — Telephone Encounter (Signed)
 Amox 875 capsules too big  Pharmacist asks can he switch to 500 mg two capsules BID?  Please advise  FYI Only or Action Required?: Action required by provider: clinical question for provider.  Patient was last seen in primary care on 04/05/2024 by Watt Mirza, MD.  Called Nurse Triage reporting Medication Refill.   Copied from CRM [id number]. Topic: Clinical - Prescription Issue >> Apr 07, 2024  4:12 PM Rea ORN wrote: Reason for CRM: Odella with [facility] calling to advise that pt stated the abx and he wants a different cap size. Monica confirm if this can be change. Reason for Disposition  [1] Pharmacy calling with prescription question AND [2] triager unable to answer question  Answer Assessment - Initial Assessment Questions 1. NAME of MEDICINE: What medicine(s) are you calling about?     Amoxicillin    2. QUESTION: What is your question? (e.g., double dose of medicine, side effect)     Pt is at [facility] pharmacy stating the capsules are too big. Monica the pharmacist is asking if the order can be changed to two 500mg  capsules BID. This RN advised Odella that the provider has to make that decision, and this message will be passed along to them.  Protocols used: Medication Question Call-A-AH

## 2024-04-08 NOTE — Telephone Encounter (Signed)
 Pharmacy not open - likely from snow.  I called and left a message at [facility] pharmacy that it was ok to change the script to Amox 500 mg caps, 2 po BID for 7 days, #28 caps

## 2024-04-25 ENCOUNTER — Ambulatory Visit: Admitting: Nurse Practitioner
